# Patient Record
Sex: Female | Born: 1940 | Race: White | Hispanic: No | Marital: Married | Smoking: Never smoker
Health system: Southern US, Community
[De-identification: ages and names within clinical notes are randomized; demographics above are authoritative.]

## PROBLEM LIST (undated history)

## (undated) DIAGNOSIS — H409 Unspecified glaucoma: Secondary | ICD-10-CM

## (undated) DIAGNOSIS — K219 Gastro-esophageal reflux disease without esophagitis: Secondary | ICD-10-CM

## (undated) DIAGNOSIS — M1990 Unspecified osteoarthritis, unspecified site: Secondary | ICD-10-CM

## (undated) DIAGNOSIS — Z853 Personal history of malignant neoplasm of breast: Principal | ICD-10-CM

## (undated) DIAGNOSIS — E785 Hyperlipidemia, unspecified: Secondary | ICD-10-CM

## (undated) DIAGNOSIS — I10 Essential (primary) hypertension: Secondary | ICD-10-CM

## (undated) DIAGNOSIS — C801 Malignant (primary) neoplasm, unspecified: Secondary | ICD-10-CM

## (undated) DIAGNOSIS — Z923 Personal history of irradiation: Secondary | ICD-10-CM

## (undated) DIAGNOSIS — M797 Fibromyalgia: Secondary | ICD-10-CM

## (undated) HISTORY — DX: Hyperlipidemia, unspecified: E78.5

## (undated) HISTORY — PX: REVISION TOTAL HIP ARTHROPLASTY: SHX766

## (undated) HISTORY — DX: Unspecified glaucoma: H40.9

## (undated) HISTORY — DX: Malignant (primary) neoplasm, unspecified: C80.1

## (undated) HISTORY — DX: Personal history of irradiation: Z92.3

## (undated) HISTORY — DX: Unspecified osteoarthritis, unspecified site: M19.90

## (undated) HISTORY — PX: NECK SURGERY: SHX720

## (undated) HISTORY — DX: Fibromyalgia: M79.7

## (undated) HISTORY — PX: BACK SURGERY: SHX140

## (undated) HISTORY — PX: KNEE SURGERY: SHX244

## (undated) HISTORY — DX: Essential (primary) hypertension: I10

## (undated) HISTORY — PX: ABDOMINAL HYSTERECTOMY: SHX81

## (undated) HISTORY — PX: COLONOSCOPY: SHX174

## (undated) HISTORY — DX: Personal history of malignant neoplasm of breast: Z85.3

## (undated) HISTORY — DX: Gastro-esophageal reflux disease without esophagitis: K21.9

---

## 1998-05-25 ENCOUNTER — Emergency Department (HOSPITAL_COMMUNITY): Admission: EM | Admit: 1998-05-25 | Discharge: 1998-05-25 | Payer: Self-pay | Admitting: Emergency Medicine

## 1999-06-26 ENCOUNTER — Other Ambulatory Visit: Admission: RE | Admit: 1999-06-26 | Discharge: 1999-06-26 | Payer: Self-pay | Admitting: Obstetrics and Gynecology

## 1999-08-21 ENCOUNTER — Encounter: Payer: Self-pay | Admitting: Obstetrics and Gynecology

## 1999-08-21 ENCOUNTER — Encounter: Admission: RE | Admit: 1999-08-21 | Discharge: 1999-08-21 | Payer: Self-pay | Admitting: Obstetrics and Gynecology

## 1999-10-30 ENCOUNTER — Ambulatory Visit (HOSPITAL_COMMUNITY): Admission: RE | Admit: 1999-10-30 | Discharge: 1999-10-30 | Payer: Self-pay | Admitting: Emergency Medicine

## 2000-08-05 ENCOUNTER — Other Ambulatory Visit: Admission: RE | Admit: 2000-08-05 | Discharge: 2000-08-05 | Payer: Self-pay | Admitting: Obstetrics and Gynecology

## 2000-08-26 ENCOUNTER — Encounter: Admission: RE | Admit: 2000-08-26 | Discharge: 2000-08-26 | Payer: Self-pay | Admitting: Obstetrics and Gynecology

## 2000-08-26 ENCOUNTER — Encounter: Payer: Self-pay | Admitting: Obstetrics and Gynecology

## 2001-08-25 ENCOUNTER — Other Ambulatory Visit: Admission: RE | Admit: 2001-08-25 | Discharge: 2001-08-25 | Payer: Self-pay | Admitting: Obstetrics and Gynecology

## 2001-09-14 ENCOUNTER — Encounter: Admission: RE | Admit: 2001-09-14 | Discharge: 2001-09-14 | Payer: Self-pay | Admitting: Obstetrics and Gynecology

## 2001-09-14 ENCOUNTER — Encounter: Payer: Self-pay | Admitting: Obstetrics and Gynecology

## 2002-08-27 ENCOUNTER — Other Ambulatory Visit: Admission: RE | Admit: 2002-08-27 | Discharge: 2002-08-27 | Payer: Self-pay | Admitting: Obstetrics and Gynecology

## 2002-09-11 ENCOUNTER — Ambulatory Visit (HOSPITAL_COMMUNITY): Admission: RE | Admit: 2002-09-11 | Discharge: 2002-09-11 | Payer: Self-pay | Admitting: Emergency Medicine

## 2003-09-13 ENCOUNTER — Ambulatory Visit (HOSPITAL_COMMUNITY): Admission: RE | Admit: 2003-09-13 | Discharge: 2003-09-13 | Payer: Self-pay | Admitting: Obstetrics and Gynecology

## 2004-01-31 ENCOUNTER — Ambulatory Visit (HOSPITAL_COMMUNITY): Admission: RE | Admit: 2004-01-31 | Discharge: 2004-02-01 | Payer: Self-pay | Admitting: Orthopaedic Surgery

## 2004-10-09 ENCOUNTER — Ambulatory Visit (HOSPITAL_COMMUNITY): Admission: RE | Admit: 2004-10-09 | Discharge: 2004-10-09 | Payer: Self-pay | Admitting: Obstetrics and Gynecology

## 2005-05-14 ENCOUNTER — Encounter: Admission: RE | Admit: 2005-05-14 | Discharge: 2005-05-14 | Payer: Self-pay | Admitting: Emergency Medicine

## 2005-12-02 ENCOUNTER — Ambulatory Visit (HOSPITAL_COMMUNITY): Admission: RE | Admit: 2005-12-02 | Discharge: 2005-12-02 | Payer: Self-pay | Admitting: Obstetrics and Gynecology

## 2006-12-29 ENCOUNTER — Ambulatory Visit (HOSPITAL_COMMUNITY): Admission: RE | Admit: 2006-12-29 | Discharge: 2006-12-29 | Payer: Self-pay | Admitting: Obstetrics and Gynecology

## 2007-01-05 ENCOUNTER — Encounter: Admission: RE | Admit: 2007-01-05 | Discharge: 2007-01-05 | Payer: Self-pay | Admitting: Obstetrics and Gynecology

## 2007-01-12 ENCOUNTER — Encounter: Admission: RE | Admit: 2007-01-12 | Discharge: 2007-01-12 | Payer: Self-pay | Admitting: Obstetrics and Gynecology

## 2007-12-14 ENCOUNTER — Ambulatory Visit (HOSPITAL_COMMUNITY): Admission: RE | Admit: 2007-12-14 | Discharge: 2007-12-14 | Payer: Self-pay | Admitting: Obstetrics and Gynecology

## 2008-01-01 ENCOUNTER — Encounter: Admission: RE | Admit: 2008-01-01 | Discharge: 2008-01-01 | Payer: Self-pay | Admitting: Obstetrics and Gynecology

## 2008-05-03 HISTORY — PX: BREAST SURGERY: SHX581

## 2008-07-16 ENCOUNTER — Encounter: Admission: RE | Admit: 2008-07-16 | Discharge: 2008-07-16 | Payer: Self-pay | Admitting: Obstetrics and Gynecology

## 2009-01-08 ENCOUNTER — Encounter (INDEPENDENT_AMBULATORY_CARE_PROVIDER_SITE_OTHER): Payer: Self-pay | Admitting: Diagnostic Radiology

## 2009-01-08 ENCOUNTER — Encounter: Admission: RE | Admit: 2009-01-08 | Discharge: 2009-01-08 | Payer: Self-pay | Admitting: Obstetrics and Gynecology

## 2009-01-17 ENCOUNTER — Encounter: Admission: RE | Admit: 2009-01-17 | Discharge: 2009-01-17 | Payer: Self-pay | Admitting: Obstetrics and Gynecology

## 2009-01-21 ENCOUNTER — Ambulatory Visit: Admission: RE | Admit: 2009-01-21 | Discharge: 2009-03-03 | Payer: Self-pay | Admitting: Radiation Oncology

## 2009-01-30 ENCOUNTER — Encounter (INDEPENDENT_AMBULATORY_CARE_PROVIDER_SITE_OTHER): Payer: Self-pay | Admitting: Surgery

## 2009-01-30 ENCOUNTER — Ambulatory Visit (HOSPITAL_BASED_OUTPATIENT_CLINIC_OR_DEPARTMENT_OTHER): Admission: RE | Admit: 2009-01-30 | Discharge: 2009-01-30 | Payer: Self-pay | Admitting: Surgery

## 2009-01-30 ENCOUNTER — Encounter: Admission: RE | Admit: 2009-01-30 | Discharge: 2009-01-30 | Payer: Self-pay | Admitting: Surgery

## 2009-01-31 ENCOUNTER — Emergency Department (HOSPITAL_COMMUNITY): Admission: EM | Admit: 2009-01-31 | Discharge: 2009-02-01 | Payer: Self-pay | Admitting: Emergency Medicine

## 2009-03-05 ENCOUNTER — Ambulatory Visit: Payer: Self-pay | Admitting: Oncology

## 2009-03-05 LAB — CBC WITH DIFFERENTIAL/PLATELET
BASO%: 0.5 % (ref 0.0–2.0)
EOS%: 5.4 % (ref 0.0–7.0)
HCT: 41.3 % (ref 34.8–46.6)
MCH: 32.4 pg (ref 25.1–34.0)
MCHC: 35 g/dL (ref 31.5–36.0)
MONO#: 0.5 10*3/uL (ref 0.1–0.9)
RBC: 4.46 10*6/uL (ref 3.70–5.45)
RDW: 12.8 % (ref 11.2–14.5)
WBC: 4.5 10*3/uL (ref 3.9–10.3)
lymph#: 1 10*3/uL (ref 0.9–3.3)

## 2009-03-06 LAB — COMPREHENSIVE METABOLIC PANEL
ALT: 13 U/L (ref 0–35)
AST: 19 U/L (ref 0–37)
CO2: 27 mEq/L (ref 19–32)
Calcium: 9.4 mg/dL (ref 8.4–10.5)
Chloride: 98 mEq/L (ref 96–112)
Potassium: 3.7 mEq/L (ref 3.5–5.3)
Sodium: 137 mEq/L (ref 135–145)
Total Protein: 6.7 g/dL (ref 6.0–8.3)

## 2009-03-06 LAB — LACTATE DEHYDROGENASE: LDH: 156 U/L (ref 94–250)

## 2009-05-07 ENCOUNTER — Ambulatory Visit: Payer: Self-pay | Admitting: Oncology

## 2009-05-09 LAB — CBC WITH DIFFERENTIAL/PLATELET
EOS%: 3.4 % (ref 0.0–7.0)
Eosinophils Absolute: 0.2 10*3/uL (ref 0.0–0.5)
MCH: 32.8 pg (ref 25.1–34.0)
MCV: 92.9 fL (ref 79.5–101.0)
MONO%: 9.5 % (ref 0.0–14.0)
NEUT#: 2.7 10*3/uL (ref 1.5–6.5)
RBC: 4.32 10*6/uL (ref 3.70–5.45)
RDW: 11.8 % (ref 11.2–14.5)
lymph#: 1.4 10*3/uL (ref 0.9–3.3)

## 2009-05-09 LAB — COMPREHENSIVE METABOLIC PANEL
AST: 18 U/L (ref 0–37)
Albumin: 4.2 g/dL (ref 3.5–5.2)
Alkaline Phosphatase: 71 U/L (ref 39–117)
Chloride: 100 mEq/L (ref 96–112)
Potassium: 3.8 mEq/L (ref 3.5–5.3)
Sodium: 135 mEq/L (ref 135–145)
Total Protein: 6.3 g/dL (ref 6.0–8.3)

## 2009-06-27 ENCOUNTER — Encounter: Admission: RE | Admit: 2009-06-27 | Discharge: 2009-06-27 | Payer: Self-pay | Admitting: Radiation Oncology

## 2009-10-30 ENCOUNTER — Ambulatory Visit: Payer: Self-pay | Admitting: Oncology

## 2009-11-04 LAB — CBC WITH DIFFERENTIAL/PLATELET
BASO%: 0.4 % (ref 0.0–2.0)
EOS%: 4.2 % (ref 0.0–7.0)
HCT: 38.2 % (ref 34.8–46.6)
LYMPH%: 29.3 % (ref 14.0–49.7)
MCH: 32.5 pg (ref 25.1–34.0)
MCHC: 35.4 g/dL (ref 31.5–36.0)
MCV: 91.9 fL (ref 79.5–101.0)
MONO%: 9.1 % (ref 0.0–14.0)
NEUT%: 57 % (ref 38.4–76.8)
Platelets: 177 10*3/uL (ref 145–400)

## 2009-11-04 LAB — COMPREHENSIVE METABOLIC PANEL
ALT: 17 U/L (ref 0–35)
AST: 25 U/L (ref 0–37)
CO2: 27 mEq/L (ref 19–32)
Creatinine, Ser: 0.82 mg/dL (ref 0.40–1.20)
Total Bilirubin: 0.5 mg/dL (ref 0.3–1.2)

## 2009-11-04 LAB — LACTATE DEHYDROGENASE: LDH: 174 U/L (ref 94–250)

## 2009-11-25 ENCOUNTER — Encounter: Admission: RE | Admit: 2009-11-25 | Discharge: 2009-11-25 | Payer: Self-pay | Admitting: Orthopedic Surgery

## 2009-11-27 ENCOUNTER — Encounter: Admission: RE | Admit: 2009-11-27 | Discharge: 2009-11-27 | Payer: Self-pay | Admitting: Orthopedic Surgery

## 2009-12-24 ENCOUNTER — Encounter: Admission: RE | Admit: 2009-12-24 | Discharge: 2009-12-24 | Payer: Self-pay | Admitting: Orthopedic Surgery

## 2010-01-26 ENCOUNTER — Encounter: Admission: RE | Admit: 2010-01-26 | Discharge: 2010-01-26 | Payer: Self-pay | Admitting: Oncology

## 2010-07-22 ENCOUNTER — Other Ambulatory Visit: Payer: Self-pay | Admitting: Oncology

## 2010-07-22 ENCOUNTER — Encounter (HOSPITAL_BASED_OUTPATIENT_CLINIC_OR_DEPARTMENT_OTHER): Payer: Medicare Other | Admitting: Oncology

## 2010-07-22 DIAGNOSIS — Z170 Estrogen receptor positive status [ER+]: Secondary | ICD-10-CM

## 2010-07-22 DIAGNOSIS — C50419 Malignant neoplasm of upper-outer quadrant of unspecified female breast: Secondary | ICD-10-CM

## 2010-07-22 LAB — CBC WITH DIFFERENTIAL/PLATELET
Basophils Absolute: 0 10*3/uL (ref 0.0–0.1)
Eosinophils Absolute: 0.2 10*3/uL (ref 0.0–0.5)
HGB: 12.9 g/dL (ref 11.6–15.9)
MCV: 89.1 fL (ref 79.5–101.0)
MONO%: 8.3 % (ref 0.0–14.0)
NEUT#: 3.7 10*3/uL (ref 1.5–6.5)
RDW: 11.8 % (ref 11.2–14.5)

## 2010-07-22 LAB — COMPREHENSIVE METABOLIC PANEL
Albumin: 3.6 g/dL (ref 3.5–5.2)
CO2: 30 mEq/L (ref 19–32)
Calcium: 8.9 mg/dL (ref 8.4–10.5)
Glucose, Bld: 90 mg/dL (ref 70–99)
Potassium: 3.1 mEq/L — ABNORMAL LOW (ref 3.5–5.3)
Sodium: 136 mEq/L (ref 135–145)
Total Protein: 6 g/dL (ref 6.0–8.3)

## 2010-07-22 LAB — LACTATE DEHYDROGENASE: LDH: 151 U/L (ref 94–250)

## 2010-07-23 LAB — CANCER ANTIGEN 27.29: CA 27.29: 32 U/mL (ref 0–39)

## 2010-07-30 ENCOUNTER — Encounter (HOSPITAL_BASED_OUTPATIENT_CLINIC_OR_DEPARTMENT_OTHER): Payer: Medicare Other | Admitting: Oncology

## 2010-07-30 DIAGNOSIS — Z170 Estrogen receptor positive status [ER+]: Secondary | ICD-10-CM

## 2010-07-30 DIAGNOSIS — C50419 Malignant neoplasm of upper-outer quadrant of unspecified female breast: Secondary | ICD-10-CM

## 2010-08-07 LAB — DIFFERENTIAL
Eosinophils Absolute: 0.3 10*3/uL (ref 0.0–0.7)
Eosinophils Relative: 7 % — ABNORMAL HIGH (ref 0–5)
Lymphocytes Relative: 27 % (ref 12–46)
Lymphs Abs: 1.2 10*3/uL (ref 0.7–4.0)
Monocytes Relative: 9 % (ref 3–12)
Neutrophils Relative %: 58 % (ref 43–77)

## 2010-08-07 LAB — URINALYSIS, ROUTINE W REFLEX MICROSCOPIC
Glucose, UA: NEGATIVE mg/dL
Hgb urine dipstick: NEGATIVE
Protein, ur: NEGATIVE mg/dL
pH: 7.5 (ref 5.0–8.0)

## 2010-08-07 LAB — COMPREHENSIVE METABOLIC PANEL
ALT: 20 U/L (ref 0–35)
AST: 24 U/L (ref 0–37)
Calcium: 9.5 mg/dL (ref 8.4–10.5)
GFR calc Af Amer: >60 mL/min (ref >60)
Sodium: 137 mEq/L (ref 135–145)
Total Protein: 6.6 g/dL (ref 6.0–8.3)

## 2010-08-07 LAB — URINE MICROSCOPIC-ADD ON

## 2010-08-07 LAB — CBC
MCHC: 35.3 g/dL (ref 30.0–36.0)
RDW: 12 % (ref 11.5–15.5)

## 2010-09-18 NOTE — Op Note (Signed)
NAMEHOLLIE, WOJAHN NO.:  [id number]   MEDICAL RECORD NO.:  [id number]          PATIENT TYPE:  OIB   LOCATION:  5021                         FACILITY:  [facility]   PHYSICIAN:  Mark C. Ophelia Charter, M.D.    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   PROCEDURE:  C4-5 and C5-6 anterior cervical diskectomies and fusion,  allograft and plating.   SURGEON:  Mark C. Ophelia Charter, M.D.   ASSISTANT:  Sandrea Matte, P.A.   ANESTHESIA:  GOT.   ESTIMATED BLOOD LOSS:  Less than 100 mL.   DRAINS:  One medium Hemovac.   PROCEDURE:  After induction of general anesthesia, orotracheal intubation,  preoperative Ancef prophylaxis, 2-pound sandbag behind the neck and head  halter traction application, the neck was prepped with Duraprep.  The usual  area was squared with towels, Betadine and Vi-Drape applied, sterile Mayo  stand at the head and thyroid sheet and draped.  Incision was made at the  midline, extending to the left, directly over the C5 vertebra.  This was  above the omohyoid and carotid sheath and contents were lateral.  The longus  colli muscles were elevated after needle localization with cross-table  lateral x-ray confirmed that the needle was in the 4-5 space.  The 4-5 space  was addressed first with teeth blades over the collateral right and left,  smooth blades up and down.  The longus colli muscles were elevated and put  the teeth of the blades underneath the longus colli.  Diskectomy was  performed with a 15 scalpel blade, Cloward curettes and micropituitary.  Operative microscope was brought in, TPS bur was used with 4-mm round bur to  bur the endplates.  Continued burring and irrigating were performed until  the posterior longitudinal ligament was obtained and visualized.   Spurs were  removed with the 1- and 2-mm Kerrisons under the draped microscope and  uncovertebral joint spurs were removed, which were considerable.  There was  about a 1-mm space posteriorly due to bridging osteophytes posteriorly that  were causing compression of the epidural space.  Once spurs were removed,  posterior longitudinal ligament was taken down.  Dura was completely  visualized.  Palpation with a nerve hook around the edges to make sure all  areas were free was performed, some additional trimming of spurs to make  sure there was complete decompression.  Burring was performed so that there  was good symmetry for the allograft.  A 7-mm straight graft was selected and  with the head-holder traction applied, it was impacted into place; based on  depth-gauge measurements, it was countersunk about 1.5 mm.  Identical  procedure was repeated at the 5-6 level using the operating microscope,  taking down the posterior longitudinal ligament, removing spurs and  stripping the gutters.  There was room on each side for egress of fluid on  each side of the graft,  then a 6-mm straight allograft was placed with the  DBX putty in it as well.  A 31 plate was selected, Synthes, and the holes  were hand-drilled.  It was checked under fluoroscopy.  A rescue screw was  placed superior on the right, since the original 4-mm screw was slightly  angled superiorly so that the screw would not sit down flat with the plate.  The hole was redrilled straight and then the rescue screw was inserted with  good purchase.  Additional screws were the regular 4.0.  All locking screws  were placed.  AP and lateral fluoroscopy was used to confirm appropriate  position and after irrigation and locking screws were tightened, Hemovac was  placed through a separate stab incision using a trocar in-out technique,  platysma closed with 4-0 Vicryl, subcuticular skin closure, tincture of  Benzoin, Steri-Strips, and Marcaine  infiltration, postop dressing, soft  cervical collar.  Instrument count and needle count were correct.       MCY/MEDQ  D:  01/31/2004  T:  02/01/2004  Job:  [id number]

## 2010-09-23 ENCOUNTER — Encounter (INDEPENDENT_AMBULATORY_CARE_PROVIDER_SITE_OTHER): Payer: Self-pay | Admitting: Surgery

## 2010-12-31 IMAGING — CR DG FOOT 2V*R*
2 series · 2 of 2 positions shown · non-contrast
Comparison: None

CLINICAL DATA: Right foot pain, swelling.

RIGHT FOOT - 2 VIEW

[view not recorded (1 of 2)]
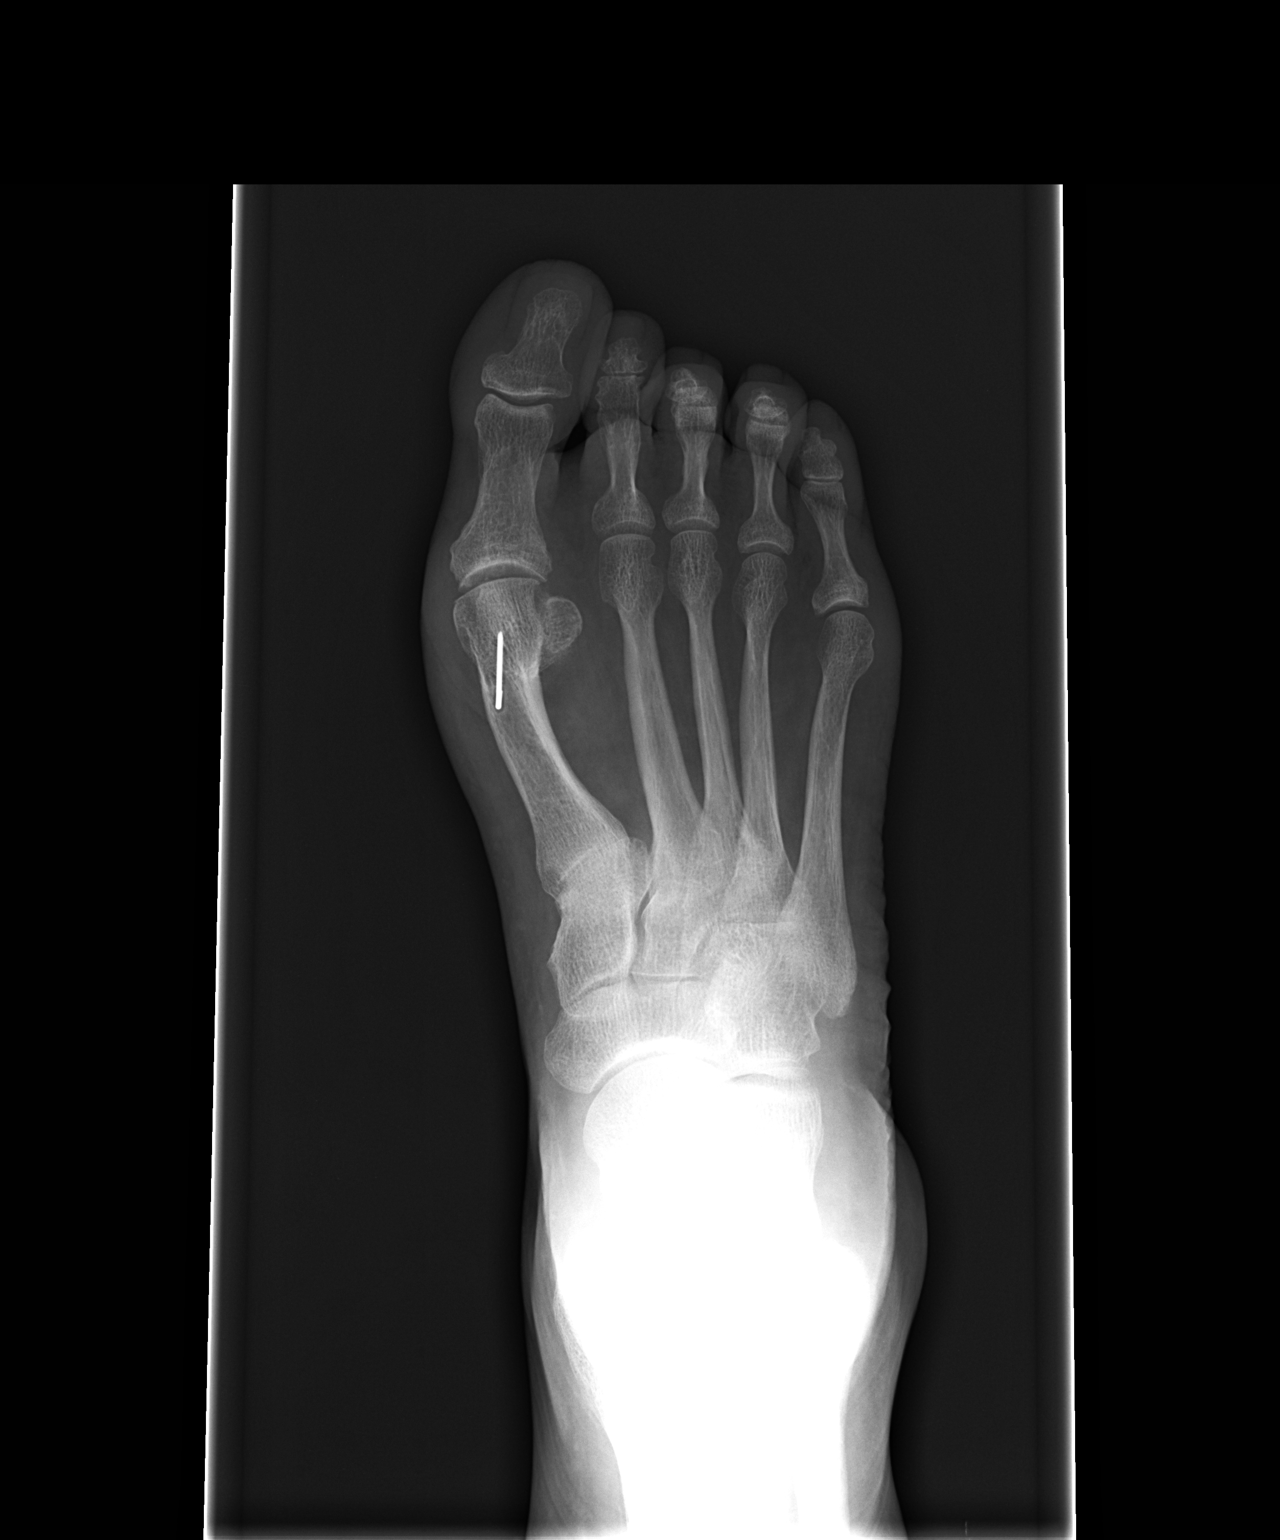

[view not recorded (2 of 2)]
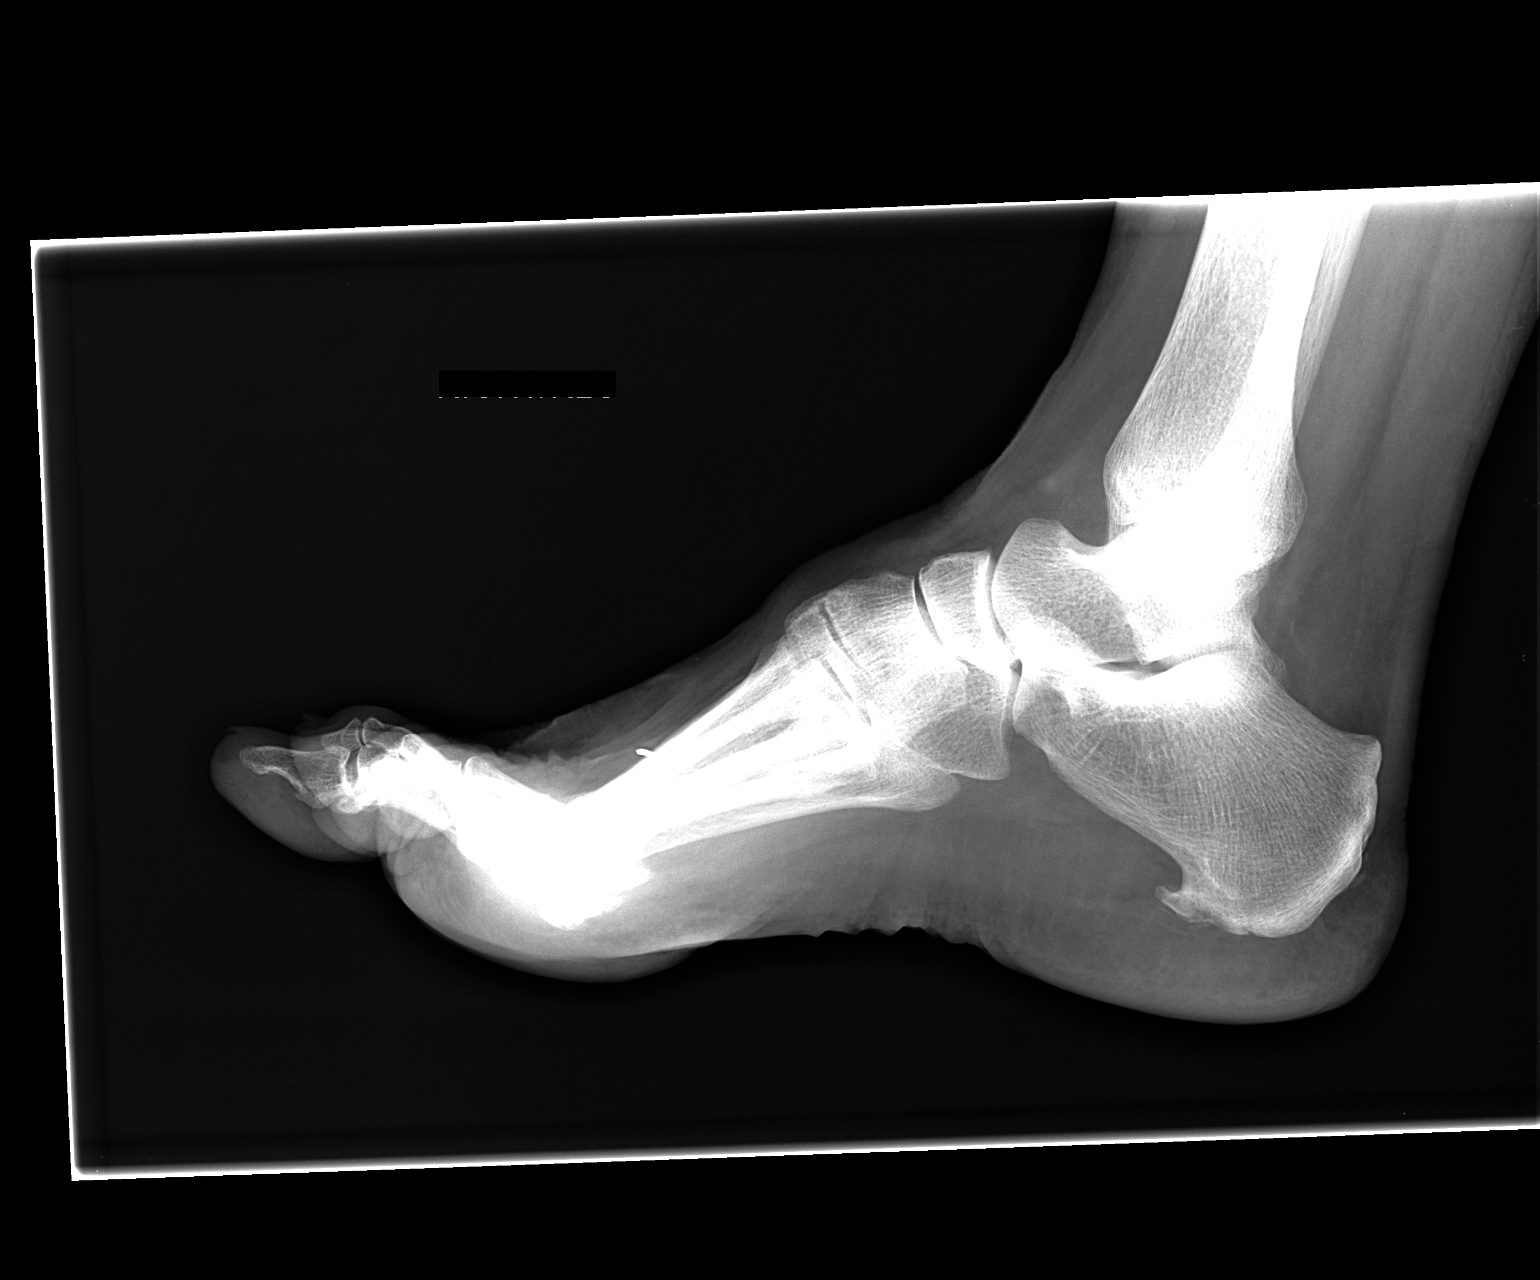

[2 of 2 positions shown; findings below may reference images not displayed]

FINDINGS: Postoperative changes are noted in the right first
metatarsal.  Plantar calcaneal spur is noted. No acute bony
abnormality.  Specifically, no fracture, subluxation, or
dislocation.  Soft tissues are intact.
IMPRESSION: Postoperative changes in the first metatarsal.

Plantar calcaneal spur

## 2011-01-06 ENCOUNTER — Other Ambulatory Visit: Payer: Self-pay | Admitting: Obstetrics and Gynecology

## 2011-01-06 ENCOUNTER — Other Ambulatory Visit: Payer: Self-pay | Admitting: Oncology

## 2011-01-06 DIAGNOSIS — Z853 Personal history of malignant neoplasm of breast: Secondary | ICD-10-CM

## 2011-01-06 DIAGNOSIS — Z9889 Other specified postprocedural states: Secondary | ICD-10-CM

## 2011-01-07 ENCOUNTER — Ambulatory Visit
Admission: RE | Admit: 2011-01-07 | Discharge: 2011-01-07 | Disposition: A | Discharge status: Home / Self Care | Payer: Medicare Other | Source: Ambulatory Visit | Attending: Radiation Oncology | Admitting: Radiation Oncology

## 2011-01-20 ENCOUNTER — Other Ambulatory Visit: Payer: Self-pay | Admitting: Oncology

## 2011-01-20 ENCOUNTER — Encounter (HOSPITAL_BASED_OUTPATIENT_CLINIC_OR_DEPARTMENT_OTHER): Payer: Medicare Other | Admitting: Oncology

## 2011-01-20 DIAGNOSIS — Z170 Estrogen receptor positive status [ER+]: Secondary | ICD-10-CM

## 2011-01-20 DIAGNOSIS — C50419 Malignant neoplasm of upper-outer quadrant of unspecified female breast: Secondary | ICD-10-CM

## 2011-01-20 LAB — CBC WITH DIFFERENTIAL/PLATELET
BASO%: 0.3 % (ref 0.0–2.0)
Eosinophils Absolute: 0.3 10*3/uL (ref 0.0–0.5)
HCT: 38.1 % (ref 34.8–46.6)
LYMPH%: 24.5 % (ref 14.0–49.7)
MCHC: 36 g/dL (ref 31.5–36.0)
MCV: 88.2 fL (ref 79.5–101.0)
MONO#: 0.5 10*3/uL (ref 0.1–0.9)
MONO%: 8.2 % (ref 0.0–14.0)
NEUT%: 62.2 % (ref 38.4–76.8)
Platelets: 208 10*3/uL (ref 145–400)
RBC: 4.32 10*6/uL (ref 3.70–5.45)
WBC: 5.9 10*3/uL (ref 3.9–10.3)

## 2011-01-20 LAB — COMPREHENSIVE METABOLIC PANEL
AST: 24 U/L (ref 0–37)
Albumin: 3.5 g/dL (ref 3.5–5.2)
Alkaline Phosphatase: 63 U/L (ref 39–117)
BUN: 11 mg/dL (ref 6–23)
Creatinine, Ser: 0.71 mg/dL (ref 0.50–1.10)
Glucose, Bld: 139 mg/dL — ABNORMAL HIGH (ref 70–99)
Total Bilirubin: 0.3 mg/dL (ref 0.3–1.2)

## 2011-01-21 LAB — VITAMIN D 25 HYDROXY (VIT D DEFICIENCY, FRACTURES): Vit D, 25-Hydroxy: 57 ng/mL (ref 30–89)

## 2011-02-09 ENCOUNTER — Ambulatory Visit
Admission: RE | Admit: 2011-02-09 | Discharge: 2011-02-09 | Disposition: A | Discharge status: Home / Self Care | Payer: Medicare Other | Source: Ambulatory Visit | Attending: Obstetrics and Gynecology | Admitting: Obstetrics and Gynecology

## 2011-02-09 DIAGNOSIS — Z9889 Other specified postprocedural states: Secondary | ICD-10-CM

## 2011-02-09 DIAGNOSIS — Z853 Personal history of malignant neoplasm of breast: Secondary | ICD-10-CM

## 2011-02-15 ENCOUNTER — Encounter (HOSPITAL_BASED_OUTPATIENT_CLINIC_OR_DEPARTMENT_OTHER): Payer: Medicare Other | Admitting: Oncology

## 2011-02-15 DIAGNOSIS — C50919 Malignant neoplasm of unspecified site of unspecified female breast: Secondary | ICD-10-CM

## 2011-02-15 DIAGNOSIS — Z170 Estrogen receptor positive status [ER+]: Secondary | ICD-10-CM

## 2011-02-15 DIAGNOSIS — Z923 Personal history of irradiation: Secondary | ICD-10-CM

## 2011-03-15 ENCOUNTER — Encounter (INDEPENDENT_AMBULATORY_CARE_PROVIDER_SITE_OTHER): Payer: Self-pay | Admitting: General Surgery

## 2011-03-15 DIAGNOSIS — C50419 Malignant neoplasm of upper-outer quadrant of unspecified female breast: Secondary | ICD-10-CM | POA: Insufficient documentation

## 2011-03-15 DIAGNOSIS — Z853 Personal history of malignant neoplasm of breast: Secondary | ICD-10-CM

## 2011-03-15 HISTORY — DX: Personal history of malignant neoplasm of breast: Z85.3

## 2011-03-16 ENCOUNTER — Encounter (INDEPENDENT_AMBULATORY_CARE_PROVIDER_SITE_OTHER): Payer: Self-pay | Admitting: Surgery

## 2011-03-16 ENCOUNTER — Ambulatory Visit (INDEPENDENT_AMBULATORY_CARE_PROVIDER_SITE_OTHER): Payer: Medicare Other | Admitting: Surgery

## 2011-03-16 VITALS — BP 126/68 | HR 66 | Temp 97.4°F | Resp 16 | Ht 65.0 in | Wt 170.4 lb

## 2011-03-16 DIAGNOSIS — Z853 Personal history of malignant neoplasm of breast: Secondary | ICD-10-CM

## 2011-03-16 NOTE — Patient Instructions (Signed)
Ill see you again in a year, but if you have any problems or concerns come to see me sooner

## 2011-03-16 NOTE — Progress Notes (Signed)
NAME: Miranda Zavala       DOB: [birth year]           DATE: 03/16/2011       MRN: [id number]   SAWSAN RIGGIO is a 70 y.o.Marland Kitchenfemale who presents for routine followup of her Left breast cancer diagnosed in 2010 and treated with lumpectomy, SLN, Mammosite radiation. She has sontinued discomfort at the lumpectomy site and it remains both tender and hard, It has not changed in one year  PFSH: She has had no significant changes since the last visit here.  ROS: There have been no significant changes since the last visit here  EXAM: General: The patient is alert, oriented, generally healty appearing, NAD. Mood and affect are normal.  Breasts:  Right breast normal to exam. Left shows the lateral lumpectomy site to be tender and hard. ? Of chronic seroma vs radiation changes. Not different from last year  Lymphatics: She has no axillary or supraclavicular adenopathy on either side.  Extremities: Full ROM of the surgical side with no lymphedema noted.  Data Reviewed: Mammogram last month negative  Impression: Doing well, with no evidence of recurrent cancer or new cancer  Plan: Will continue to follow up on an annual basis here.

## 2011-03-31 NOTE — Progress Notes (Signed)
CC:   Georgianne Fick, M.D. Oneita Hurt, M.D. Huel Cote, M.D. Currie Paris, M.D.  PROBLEM: 1. T1c N0, ER/PR positive breast cancer, status post lumpectomy on     01/03/2009, status post MammoSite radiation based therapy completed     on 02/25/2009, on Tamoxifen. 2. History of fibromyalgia.  INTERVAL HISTORY:  Ms. Miranda Zavala returns for followup.  Since being seen last, she has been doing fairly well.  She saw Dr. Kathrynn Running for review of her MammoSite treatment.  She appears to be doing well from that perspective.  She has no other complaints.  ECOG status is zero.  MEDICATIONS:  The list was reviewed.  She continues on HydroDIURIL, Mevacor, Tamoxifen, __________, Elavil, Prilosec, and vitamin C.  As far as Tamoxifen is concerned, she has had no significant side effects from this in terms of vaginal bleeding, etc.  PHYSICAL EXAMINATION:  General Appearance:  A pleasant, alert woman looking stated age.  Vital Signs:  Blood pressure is 159/74. Temperature 98.1.  Pulse 74.  Respiratory rate 20.  Weight is 171.7. Head and Neck Exam:  No palpable adenopathy in the head and neck area. Trachea is midline.  No thyromegaly.  Lungs:  Clear.  Heart:  Sounds are normal.  Breast Exam:  The lumpectomy site appears to be doing fairly well.  There is a seroma present.  This is fairly mobile and slightly nodular.  No other evidence of nipple retraction or other skin changes. The contralateral breast is normal.  Both axillae are negative. Abdomen:  Soft.  There is no palpable hepatosplenomegaly.  No inguinal adenopathy.  Extremities:  No peripheral cyanosis, clubbing, or edema.  IMPRESSION AND PLAN:  Ms. Gallen is doing well.  I will see her again in followup in 6 months' time with the appropriate imaging studies.    ______________________________ Pierce Crane, M.D., F.R.C.P.C. PR/MEDQ  D:  03/31/2011  T:  03/31/2011  Job:  [id number]

## 2011-06-05 ENCOUNTER — Telehealth: Payer: Self-pay | Admitting: Oncology

## 2011-06-05 NOTE — Telephone Encounter (Signed)
called pt lmovm for appts for april2013

## 2011-07-27 ENCOUNTER — Telehealth: Payer: Self-pay | Admitting: *Deleted

## 2011-07-27 NOTE — Telephone Encounter (Signed)
called patient patient confirmed over the phone the new date and time on 08-16-2011 starting at 1:30pm

## 2011-08-16 ENCOUNTER — Ambulatory Visit (HOSPITAL_BASED_OUTPATIENT_CLINIC_OR_DEPARTMENT_OTHER): Payer: Medicare Other | Admitting: Oncology

## 2011-08-16 ENCOUNTER — Other Ambulatory Visit (HOSPITAL_BASED_OUTPATIENT_CLINIC_OR_DEPARTMENT_OTHER): Payer: Medicare Other | Admitting: Lab

## 2011-08-16 VITALS — BP 146/76 | HR 93 | Temp 98.3°F | Ht 65.0 in | Wt 172.3 lb

## 2011-08-16 DIAGNOSIS — Z853 Personal history of malignant neoplasm of breast: Secondary | ICD-10-CM

## 2011-08-16 DIAGNOSIS — C50419 Malignant neoplasm of upper-outer quadrant of unspecified female breast: Secondary | ICD-10-CM

## 2011-08-16 DIAGNOSIS — Z170 Estrogen receptor positive status [ER+]: Secondary | ICD-10-CM

## 2011-08-16 DIAGNOSIS — Z8501 Personal history of malignant neoplasm of esophagus: Secondary | ICD-10-CM

## 2011-08-16 LAB — CBC WITH DIFFERENTIAL/PLATELET
Basophils Absolute: 0 10*3/uL (ref 0.0–0.1)
Eosinophils Absolute: 0.2 10*3/uL (ref 0.0–0.5)
HCT: 39.4 % (ref 34.8–46.6)
LYMPH%: 22.1 % (ref 14.0–49.7)
MCV: 92.5 fL (ref 79.5–101.0)
MONO#: 0.5 10*3/uL (ref 0.1–0.9)
MONO%: 7.5 % (ref 0.0–14.0)
NEUT#: 4.1 10*3/uL (ref 1.5–6.5)
NEUT%: 66.3 % (ref 38.4–76.8)
Platelets: 204 10*3/uL (ref 145–400)
RBC: 4.26 10*6/uL (ref 3.70–5.45)
nRBC: 0 % (ref 0–0)

## 2011-08-16 MED ORDER — ANASTROZOLE 1 MG PO TABS: 1.0000 mg | ORAL_TABLET | Freq: Every day | ORAL | Status: AC

## 2011-08-16 NOTE — Patient Instructions (Addendum)

## 2011-08-16 NOTE — Progress Notes (Signed)
Hematology and Oncology Follow Up Visit  NATASA STIGALL [id number] [birth year] 71 y.o. 08/16/2011 3:16 PM PCP  Principle Diagnosis: T1CN0 breast cancer s/p lumpectomy and mammosite , completed 02/25/09, on tamoxifen.  Interim History:  There have been no intercurrent illness, hospitalizations or medication changes.  Medications: I have reviewed the patient's current medications.  Allergies:  Allergies  Allergen Reactions  . Demerol Other (See Comments)    Made patient feel like she was "out of it", not really aware of surroundings.  . Codeine Itching    All over the body    Past Medical History, Surgical history, Social history, and Family History were reviewed and updated.  Review of Systems: Constitutional:  Negative for fever, chills, night sweats, anorexia, weight loss, pain. Cardiovascular: no chest pain or dyspnea on exertion Respiratory: no cough, shortness of breath, or wheezing Neurological: no TIA or stroke symptoms Dermatological: negative ENT: negative Skin Gastrointestinal: no abdominal pain, change in bowel habits, or black or bloody stools Genito-Urinary: no dysuria, trouble voiding, or hematuria Hematological and Lymphatic: negative Breast: positive for lt breast discomfort Musculoskeletal: negative Remaining ROS negative.  Physical Exam: Blood pressure 146/76, pulse 93, temperature 98.3 F (36.8 C), temperature source Oral, height 5\' 5"  (1.651 m), weight 172 lb 4.8 oz (78.155 kg). ECOG:  General appearance: alert, cooperative and appears stated age Head: Normocephalic, without obvious abnormality, atraumatic Neck: no adenopathy, no carotid bruit, no JVD, supple, symmetrical, trachea midline and thyroid not enlarged, symmetric, no tenderness/mass/nodules Lymph nodes: Cervical, supraclavicular, and axillary nodes normal. Cardiac : regular rate and rhythm, no murmurs or gallops Pulmonary:clear to auscultation bilaterally and normal percussion  bilaterally Breasts: inspection negative, no nipple discharge or bleeding, no masses or nodularity palpable and negative apart from seroma in breast which is stable. Abdomen:soft, non-tender; bowel sounds normal; no masses,  no organomegaly Extremities negative Neuro: alert, oriented, normal speech, no focal findings or movement disorder noted  Lab Results: Lab Results  Component Value Date   WBC 6.2 08/16/2011   HGB 13.9 08/16/2011   HCT 39.4 08/16/2011   MCV 92.5 08/16/2011   PLT 204 08/16/2011     Chemistry      Component Value Date/Time   NA 137 01/20/2011 1513   NA 137 01/20/2011 1513   K 2.8* 01/20/2011 1513   K 2.8* 01/20/2011 1513   CL 98 01/20/2011 1513   CL 98 01/20/2011 1513   CO2 28 01/20/2011 1513   CO2 28 01/20/2011 1513   BUN 11 01/20/2011 1513   BUN 11 01/20/2011 1513   CREATININE 0.71 01/20/2011 1513   CREATININE 0.71 01/20/2011 1513      Component Value Date/Time   CALCIUM 9.0 01/20/2011 1513   CALCIUM 9.0 01/20/2011 1513   ALKPHOS 63 01/20/2011 1513   ALKPHOS 63 01/20/2011 1513   AST 24 01/20/2011 1513   AST 24 01/20/2011 1513   ALT 19 01/20/2011 1513   ALT 19 01/20/2011 1513   BILITOT 0.3 01/20/2011 1513   BILITOT 0.3 01/20/2011 1513      .pathology. Radiological Studies: chest X-ray n/a Mammogram Due 10/13 Bone density Due 8/13  Impression and Plan: Ms Siemen is doing well, i havre explained the switch to arimidex and she wil stay on tamoxifen for another 3 months and then switch. We will have  abone density and mammogram before she returns.  More than 50% of the visit was spent in patient-related counselling   Pierce Crane, MD 4/15/20133:16 PM

## 2011-08-17 LAB — COMPREHENSIVE METABOLIC PANEL
BUN: 12 mg/dL (ref 6–23)
CO2: 28 mEq/L (ref 19–32)
Calcium: 9.2 mg/dL (ref 8.4–10.5)
Chloride: 100 mEq/L (ref 96–112)
Creatinine, Ser: 0.79 mg/dL (ref 0.50–1.10)
Glucose, Bld: 117 mg/dL — ABNORMAL HIGH (ref 70–99)
Total Bilirubin: 0.4 mg/dL (ref 0.3–1.2)

## 2011-08-17 LAB — VITAMIN D 25 HYDROXY (VIT D DEFICIENCY, FRACTURES): Vit D, 25-Hydroxy: 72 ng/mL (ref 30–89)

## 2011-08-19 ENCOUNTER — Other Ambulatory Visit: Payer: Medicare Other | Admitting: Lab

## 2011-08-19 ENCOUNTER — Ambulatory Visit: Payer: Medicare Other | Admitting: Oncology

## 2011-09-01 ENCOUNTER — Ambulatory Visit: Payer: Medicare Other | Admitting: Oncology

## 2011-09-01 ENCOUNTER — Other Ambulatory Visit: Payer: Medicare Other | Admitting: Lab

## 2012-01-06 ENCOUNTER — Ambulatory Visit: Payer: Medicare Other | Admitting: Radiation Oncology

## 2012-01-13 ENCOUNTER — Ambulatory Visit: Payer: Medicare Other | Admitting: Radiation Oncology

## 2012-01-19 ENCOUNTER — Encounter: Payer: Self-pay | Admitting: Radiation Oncology

## 2012-01-19 DIAGNOSIS — Z923 Personal history of irradiation: Secondary | ICD-10-CM | POA: Insufficient documentation

## 2012-01-20 ENCOUNTER — Ambulatory Visit
Admission: RE | Admit: 2012-01-20 | Discharge: 2012-01-20 | Disposition: A | Discharge status: Home / Self Care | Payer: Medicare Other | Source: Ambulatory Visit | Attending: Radiation Oncology | Admitting: Radiation Oncology

## 2012-01-20 ENCOUNTER — Encounter: Payer: Self-pay | Admitting: Radiation Oncology

## 2012-01-20 VITALS — BP 137/62 | HR 74 | Temp 97.8°F | Resp 20 | Wt 172.5 lb

## 2012-01-20 DIAGNOSIS — Z923 Personal history of irradiation: Secondary | ICD-10-CM

## 2012-01-20 DIAGNOSIS — Z853 Personal history of malignant neoplasm of breast: Secondary | ICD-10-CM

## 2012-01-20 NOTE — Assessment & Plan Note (Signed)
The patient is continuing antiestrogen therapy switching from tamoxifen to Arimidex.  Mammography shows no evidence of recurrence. She has undergone breast conservation with no evidence of disease, a good cosmetic result, but posttreatment fibrosis/pain.

## 2012-01-20 NOTE — Progress Notes (Signed)
  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  She continues to several with tenderness at the lumpectomy site related to her partial breast radiation. Otherwise, she is without complaint. She undergoes annual mammography. She continues to follow annually with Dr. Jamey Ripa as well as Dr. Donnie Coffin more frequently. She is continuing to receive tamoxifen therapy and will switch to Arimidex soon.                              ALLERGIES:  is allergic to demerol and codeine.  Meds: Current Outpatient Prescriptions  Medication Sig Dispense Refill  . anastrozole (ARIMIDEX) 1 MG tablet 1 mg daily.       . Cholecalciferol (VITAMIN D PO) Take 1,000 mg by mouth daily.        . clobetasol (TEMOVATE) 0.05 % ointment 1 application as needed.       . gabapentin (NEURONTIN) 300 MG capsule       . hydrochlorothiazide (HYDRODIURIL) 25 MG tablet 25 mg daily.       Marland Kitchen ibuprofen (ADVIL,MOTRIN) 800 MG tablet 800 mg as needed.       Marland Kitchen losartan (COZAAR) 100 MG tablet 100 mg daily.       Marland Kitchen LOVASTATIN PO Take 40 mg by mouth daily.        . meloxicam (MOBIC) 7.5 MG tablet       . omeprazole (PRILOSEC) 40 MG capsule Take 40 mg by mouth as needed.        . potassium chloride SA (K-DUR,KLOR-CON) 20 MEQ tablet 20 mEq daily.         Physical Findings: The patient is in no acute distress. Patient is alert and oriented.  weight is 172 lb 8 oz (78.245 kg). Her oral temperature is 97.8 F (36.6 C). Her blood pressure is 137/62 and her pulse is 74. Her respiration is 20.  the supra-auricular regions free of  adenopathy. Bilateral axillae are free of adenopathy. Lungs are clear to auscultation heart is regular. Examination of breasts in seated position reveals some retraction at the lobectomy site in the upper-outer quadrant with no significant skin changes or significant architectural distortion. Palpation of the left breast in a supine position reveals a fibrotic region at the lumpectomy site which is tender to palpation. This is the dominant mass noted within the breast. The remainder of the breast contains atrophic glandular tissue..  No significant changes.  Impression:  The patient is recovering from the effects of radiation.  She is a good cosmetic result following breast conservation with no evidence of disease. She does have some chronic pain in the treated breast which may be related to her accelerated partial breast radiation.  Plan:  The patient will continue to undergo annual mammography and followup with her surgeon and medical oncologist. She'll return to radiation oncology clinic in the future on an as-needed basis.  _____________________________________  Artist Pais Kathrynn Running, M.D.

## 2012-01-20 NOTE — Progress Notes (Signed)
Pt reports she continues to have left breast "soreness that comes and goes". She denies any other problems. States she received a letter to call and schedule her mammogram for Oct 2013. Pt taking Arimidex. Dr Donnie Coffin d/c Tamoxifen.

## 2012-01-31 ENCOUNTER — Other Ambulatory Visit (INDEPENDENT_AMBULATORY_CARE_PROVIDER_SITE_OTHER): Payer: Self-pay | Admitting: Surgery

## 2012-01-31 DIAGNOSIS — Z853 Personal history of malignant neoplasm of breast: Secondary | ICD-10-CM

## 2012-01-31 DIAGNOSIS — Z9889 Other specified postprocedural states: Secondary | ICD-10-CM

## 2012-02-10 ENCOUNTER — Ambulatory Visit
Admission: RE | Admit: 2012-02-10 | Discharge: 2012-02-10 | Disposition: A | Discharge status: Home / Self Care | Payer: Medicare Other | Source: Ambulatory Visit | Attending: Surgery | Admitting: Surgery

## 2012-02-10 DIAGNOSIS — Z853 Personal history of malignant neoplasm of breast: Secondary | ICD-10-CM

## 2012-02-10 DIAGNOSIS — Z9889 Other specified postprocedural states: Secondary | ICD-10-CM

## 2012-02-18 ENCOUNTER — Encounter (INDEPENDENT_AMBULATORY_CARE_PROVIDER_SITE_OTHER): Payer: Self-pay | Admitting: Surgery

## 2012-02-18 ENCOUNTER — Ambulatory Visit (INDEPENDENT_AMBULATORY_CARE_PROVIDER_SITE_OTHER): Payer: Medicare Other | Admitting: Surgery

## 2012-02-18 VITALS — BP 136/72 | HR 88 | Temp 97.5°F | Resp 18 | Ht 65.0 in | Wt 171.0 lb

## 2012-02-18 DIAGNOSIS — Z853 Personal history of malignant neoplasm of breast: Secondary | ICD-10-CM

## 2012-02-18 NOTE — Patient Instructions (Signed)
We will see you again on an as needed basis. Please call the office at [phone] if you have any questions or concerns. Thank you for allowing [place] to take care of you. Continue to have annual mammograms

## 2012-02-18 NOTE — Progress Notes (Signed)
NAME: MARIYAH UPSHAW       DOB: 17-Apr-1941           DATE: 02/18/2012       MRN: [id number]   LEEZA HEINER is a 71 y.o.Marland Kitchenfemale who presents for routine followup of her Left breast cancer, ILC, StageI. Receptor+, diagnosed in 2010 and treated with lumpectomy, SLN, Mammosite radiation. She has continued tenderness at the lumpectomy site, but no longer any pain.  PFSH: She has had no significant changes since the last visit here.  ROS: There have been no significant changes since the last visit here  EXAM: General: The patient is alert, oriented, generally healty appearing, NAD. Mood and affect are normal.  Breasts:  Right breast normal to exam. Left shows the lateral lumpectomy site to be tender and hard. ? Of chronic seroma vs radiation changes. Not different from last year  Lymphatics: She has no axillary or supraclavicular adenopathy on either side.  Extremities: Full ROM of the surgical side with no lymphedema noted.  Data Reviewed: Mammogram last week OK, no change, looks like seroma at lumpectomy site  Impression: Doing well, with no evidence of recurrent cancer or new cancer  Plan: RTC PRN. She would like to reduce the number of physicians seeing her and will continue to see Dr Donnie Coffin. Recommended she continue annual mammograms

## 2012-03-07 ENCOUNTER — Other Ambulatory Visit: Payer: Self-pay | Admitting: *Deleted

## 2012-03-07 DIAGNOSIS — C50419 Malignant neoplasm of upper-outer quadrant of unspecified female breast: Secondary | ICD-10-CM

## 2012-03-08 ENCOUNTER — Other Ambulatory Visit (HOSPITAL_BASED_OUTPATIENT_CLINIC_OR_DEPARTMENT_OTHER): Payer: Medicare Other | Admitting: Lab

## 2012-03-08 ENCOUNTER — Ambulatory Visit (HOSPITAL_BASED_OUTPATIENT_CLINIC_OR_DEPARTMENT_OTHER): Payer: Medicare Other | Admitting: Oncology

## 2012-03-08 VITALS — BP 144/73 | HR 72 | Temp 97.5°F | Resp 20 | Ht 65.0 in | Wt 173.4 lb

## 2012-03-08 DIAGNOSIS — Z170 Estrogen receptor positive status [ER+]: Secondary | ICD-10-CM

## 2012-03-08 DIAGNOSIS — C50419 Malignant neoplasm of upper-outer quadrant of unspecified female breast: Secondary | ICD-10-CM

## 2012-03-08 DIAGNOSIS — M2550 Pain in unspecified joint: Secondary | ICD-10-CM

## 2012-03-08 LAB — CBC WITH DIFFERENTIAL/PLATELET
BASO%: 1.1 % (ref 0.0–2.0)
Eosinophils Absolute: 0.4 10*3/uL (ref 0.0–0.5)
HCT: 38.3 % (ref 34.8–46.6)
LYMPH%: 27.3 % (ref 14.0–49.7)
MCHC: 35.5 g/dL (ref 31.5–36.0)
MCV: 90.7 fL (ref 79.5–101.0)
MONO#: 0.6 10*3/uL (ref 0.1–0.9)
MONO%: 9.4 % (ref 0.0–14.0)
NEUT%: 56.8 % (ref 38.4–76.8)
Platelets: 195 10*3/uL (ref 145–400)
RBC: 4.22 10*6/uL (ref 3.70–5.45)
WBC: 6.7 10*3/uL (ref 3.9–10.3)

## 2012-03-08 LAB — COMPREHENSIVE METABOLIC PANEL (CC13)
BUN: 13 mg/dL (ref 7.0–26.0)
CO2: 29 mEq/L (ref 22–29)
Calcium: 9.6 mg/dL (ref 8.4–10.4)
Chloride: 101 mEq/L (ref 98–107)
Creatinine: 0.8 mg/dL (ref 0.6–1.1)
Total Bilirubin: 0.57 mg/dL (ref 0.20–1.20)

## 2012-03-08 LAB — LACTATE DEHYDROGENASE (CC13): LDH: 205 U/L (ref 125–220)

## 2012-03-08 NOTE — Progress Notes (Signed)
Hematology and Oncology Follow Up Visit  Miranda Zavala [id number] [birth year] 71 y.o. 03/08/2012 1:14 PM PCP  Principle Diagnosis: T1CN0 breast cancer s/p lumpectomy and mammosite , completed 02/25/09, on tamoxifen, on arimidex.  Interim History:  There have been no intercurrent illness, hospitalizations or medication changes.  Medications: I have reviewed the patient's current medications.  Allergies:  Allergies  Allergen Reactions  . Demerol Other (See Comments)    Made patient feel like she was "out of it", not really aware of surroundings.  . Codeine Itching    All over the body    Past Medical History, Surgical history, Social history, and Family History were reviewed and updated.  Review of Systems: Constitutional:  Negative for fever, chills, night sweats, anorexia, weight loss, pain. Cardiovascular: no chest pain or dyspnea on exertion Respiratory: no cough, shortness of breath, or wheezing Neurological: no TIA or stroke symptoms Dermatological: negative ENT: negative Skin Gastrointestinal: no abdominal pain, change in bowel habits, or black or bloody stools Genito-Urinary: no dysuria, trouble voiding, or hematuria Hematological and Lymphatic: negative Breast: positive for lt breast discomfort Musculoskeletal: negative Remaining ROS negative.  Physical Exam: Blood pressure 144/73, pulse 72, temperature 97.5 F (36.4 C), resp. rate 20, height 5\' 5"  (1.651 m), weight 173 lb 6.4 oz (78.654 kg). ECOG:  General appearance: alert, cooperative and appears stated age Head: Normocephalic, without obvious abnormality, atraumatic Neck: no adenopathy, no carotid bruit, no JVD, supple, symmetrical, trachea midline and thyroid not enlarged, symmetric, no tenderness/mass/nodules Lymph nodes: Cervical, supraclavicular, and axillary nodes normal. Cardiac : regular rate and rhythm, no murmurs or gallops Pulmonary:clear to auscultation bilaterally and normal percussion  bilaterally Breasts: inspection negative, no nipple discharge or bleeding, no masses or nodularity palpable and negative apart from seroma in lt. breast which is stable. Abdomen:soft, non-tender; bowel sounds normal; no masses,  no organomegaly Extremities negative Neuro: alert, oriented, normal speech, no focal findings or movement disorder noted  Lab Results: Lab Results  Component Value Date   WBC 6.7 03/08/2012   HGB 13.6 03/08/2012   HCT 38.3 03/08/2012   MCV 90.7 03/08/2012   PLT 195 03/08/2012     Chemistry      Component Value Date/Time   NA 139 08/16/2011 1332   K 3.4* 08/16/2011 1332   CL 100 08/16/2011 1332   CO2 28 08/16/2011 1332   BUN 12 08/16/2011 1332   CREATININE 0.79 08/16/2011 1332      Component Value Date/Time   CALCIUM 9.2 08/16/2011 1332   ALKPHOS 68 08/16/2011 1332   AST 23 08/16/2011 1332   ALT 14 08/16/2011 1332   BILITOT 0.4 08/16/2011 1332      .pathology. Radiological Studies: chest X-ray n/a Mammogram  Bone density   Impression and Plan: Ms Wrubel is doing well, She is tolerating arimidex and having some joint pains which are chronic. I will see her in 6 months with a f/u bone density test. More than 50% of the visit was spent in patient-related counselling   Pierce Crane, MD 11/6/20131:14 PM

## 2012-03-09 LAB — CANCER ANTIGEN 27.29: CA 27.29: 31 U/mL (ref 0–39)

## 2012-07-12 ENCOUNTER — Other Ambulatory Visit: Payer: Self-pay | Admitting: *Deleted

## 2012-07-12 MED ORDER — ANASTROZOLE 1 MG PO TABS: 1.0000 mg | ORAL_TABLET | Freq: Every day | ORAL | Status: DC

## 2012-07-12 NOTE — Telephone Encounter (Signed)
Received call from patient stating she needed a refill on her Arimidex.  Rx filled and also was able to confirm a follow up appt. With Norina Buzzard on 09/08/12 at 10am.  Then will become Dr.Khan.

## 2012-07-13 ENCOUNTER — Other Ambulatory Visit: Payer: Self-pay | Admitting: *Deleted

## 2012-07-13 DIAGNOSIS — C50912 Malignant neoplasm of unspecified site of left female breast: Secondary | ICD-10-CM

## 2012-07-13 MED ORDER — ANASTROZOLE 1 MG PO TABS: 1.0000 mg | ORAL_TABLET | Freq: Every day | ORAL | Status: DC

## 2012-07-13 NOTE — Telephone Encounter (Signed)
Faxed to [facility] at [phone].

## 2012-09-08 ENCOUNTER — Telehealth: Payer: Self-pay | Admitting: *Deleted

## 2012-09-08 ENCOUNTER — Ambulatory Visit (HOSPITAL_BASED_OUTPATIENT_CLINIC_OR_DEPARTMENT_OTHER): Payer: Medicare Other | Admitting: Oncology

## 2012-09-08 ENCOUNTER — Encounter: Payer: Self-pay | Admitting: Oncology

## 2012-09-08 VITALS — BP 149/80 | HR 81 | Temp 97.7°F | Resp 20 | Ht 65.0 in | Wt 175.2 lb

## 2012-09-08 DIAGNOSIS — C50412 Malignant neoplasm of upper-outer quadrant of left female breast: Secondary | ICD-10-CM

## 2012-09-08 DIAGNOSIS — Z170 Estrogen receptor positive status [ER+]: Secondary | ICD-10-CM

## 2012-09-08 DIAGNOSIS — C50419 Malignant neoplasm of upper-outer quadrant of unspecified female breast: Secondary | ICD-10-CM

## 2012-09-08 NOTE — Progress Notes (Signed)
OFFICE PROGRESS NOTE  CC  RAMACHANDRAN,AJITH, MD [address] [place] [address]  DIAGNOSIS: 72 year old female with history of left breast cancer diagnosed 2010 treated with lumpectomy sentinel lymph node and MammoSite radiation.  PRIOR THERAPY:  #1 patient presented with an abnormal mammogram that revealed a mass in the left breast. She subsequently underwent a lumpectomy with MammoSite. Tumor was T1 C. N0. She completed MammoSite 02/25/2009. She thereafter began tamoxifen 20 mg daily.  #2 patient was then switched to Arimidex 1 mg daily in April 2013 which she is tolerating so far without any significant problems.  CURRENT THERAPY: Arimidex 1 mg daily  INTERVAL HISTORY: Miranda Zavala 72 y.o. female returns for followup visit. Overall she's doing well without any complaints. She denies any fevers chills night sweats headaches shortness of breath or chest pains. Her left breast site is still tender and hard. She's not had any joint pain or aches. Remainder of the 10 point review of systems is negative.  MEDICAL HISTORY: Past Medical History  Diagnosis Date  . Arthritis   . Fibromyalgia   . Cancer     breast  . GERD (gastroesophageal reflux disease)   . Hyperlipidemia   . Hypertension   . 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 Hx of radiation therapy sept -oct 2010    mammosite    ALLERGIES:  is allergic to demerol and codeine.  MEDICATIONS:  Current Outpatient Prescriptions  Medication Sig Dispense Refill  . anastrozole (ARIMIDEX) 1 MG tablet Take 1 tablet (1 mg total) by mouth daily.  90 tablet  3  . CALCIUM PO Take by mouth daily.      . Cholecalciferol (VITAMIN D PO) Take 1,000 mg by mouth daily.        . clobetasol (TEMOVATE) 0.05 % ointment 1 application as needed.       . gabapentin (NEURONTIN) 300 MG capsule       . hydrochlorothiazide (HYDRODIURIL) 25 MG tablet 25 mg daily.       Marland Kitchen  losartan (COZAAR) 100 MG tablet 100 mg daily.       Marland Kitchen LOVASTATIN PO Take 40 mg by mouth daily.        . meloxicam (MOBIC) 7.5 MG tablet       . omeprazole (PRILOSEC) 40 MG capsule Take 40 mg by mouth as needed.        . potassium chloride SA (K-DUR,KLOR-CON) 20 MEQ tablet 20 mEq daily.       . timolol (TIMOPTIC) 0.5 % ophthalmic solution        No current facility-administered medications for this visit.    SURGICAL HISTORY:  Past Surgical History  Procedure Laterality Date  . Back surgery      Patient does not remember date.  . Neck surgery      Patient does not remember date.  . Shoulder surgery      Patient does not remember date.  . Breast surgery  2010  . Abdominal hysterectomy      Patient does not remember date    REVIEW OF SYSTEMS:  Pertinent items are noted in HPI.   HEALTH MAINTENANCE:  PHYSICAL EXAMINATION: Blood pressure 149/80, pulse 81, temperature 97.7 F (36.5 C), temperature source Oral, resp. rate 20, height 5\' 5"  (1.651 m), weight 175 lb 3.2 oz (79.47 kg). Body mass index is 29.15 kg/(m^2). ECOG PERFORMANCE STATUS: 0 - Asymptomatic   Patient is awake alert  in no acute distress HEENT exam EOMI PERRLA sclerae anicteric no conjunctival pallor oral mucosa is moist neck is supple lungs are clear cardiovascular is regular rate rhythm abdomen is soft nontender no HSM extremities no edema neuro patient's alert oriented otherwise nonfocal Left breast healing surgical scar there is still a not noted at the MammoSite site this is slightly tender. Right breast no masses or nipple discharge.   LABORATORY DATA: Lab Results  Component Value Date   WBC 6.7 03/08/2012   HGB 13.6 03/08/2012   HCT 38.3 03/08/2012   MCV 90.7 03/08/2012   PLT 195 03/08/2012      Chemistry      Component Value Date/Time   NA 136 03/08/2012 1236   NA 139 08/16/2011 1332   K 3.4* 03/08/2012 1236   K 3.4* 08/16/2011 1332   CL 101 03/08/2012 1236   CL 100 08/16/2011 1332   CO2 29 03/08/2012  1236   CO2 28 08/16/2011 1332   BUN 13.0 03/08/2012 1236   BUN 12 08/16/2011 1332   CREATININE 0.8 03/08/2012 1236   CREATININE 0.79 08/16/2011 1332      Component Value Date/Time   CALCIUM 9.6 03/08/2012 1236   CALCIUM 9.2 08/16/2011 1332   ALKPHOS 85 03/08/2012 1236   ALKPHOS 68 08/16/2011 1332   AST 27 03/08/2012 1236   AST 23 08/16/2011 1332   ALT 21 03/08/2012 1236   ALT 14 08/16/2011 1332   BILITOT 0.57 03/08/2012 1236   BILITOT 0.4 08/16/2011 1332       RADIOGRAPHIC STUDIES:  No results found.  ASSESSMENT: 72 year old female with  #1 history of invasive ductal carcinoma of the left breast status post lumpectomy with sentinel lymph node biopsy for a T1 C. N0 disease. She underwent MammoSite radiotherapy followed by tamoxifen for 2 years. Thereafter she was switched to Arimidex 1 mg daily which she is tolerating well.   PLAN:   #1 patient will continue Arimidex 1 mg daily.  #2 we discussed obtaining a bone density scan and I have ordered this.  #3 I will see her back in 6 months time.   All questions were answered. The patient knows to call the clinic with any problems, questions or concerns. We can certainly see the patient much sooner if necessary.  I spent 25 minutes counseling the patient face to face. The total time spent in the appointment was 30 minutes.    Drue Second, MD Medical/Oncology [facility] [phone] (beeper) [phone] (Office)  09/08/2012, 11:01 AM

## 2012-09-08 NOTE — Telephone Encounter (Signed)
appts made and printed...td 

## 2012-09-08 NOTE — Patient Instructions (Addendum)
Continue arimidex 1 mg daily  I have ordered bone density scan  I will see you back in 6 months

## 2012-09-12 ENCOUNTER — Ambulatory Visit
Admission: RE | Admit: 2012-09-12 | Discharge: 2012-09-12 | Disposition: A | Discharge status: Home / Self Care | Payer: Medicare Other | Source: Ambulatory Visit | Attending: Oncology | Admitting: Oncology

## 2012-09-12 DIAGNOSIS — C50412 Malignant neoplasm of upper-outer quadrant of left female breast: Secondary | ICD-10-CM

## 2012-10-27 ENCOUNTER — Other Ambulatory Visit: Payer: Self-pay | Admitting: Family Medicine

## 2012-10-27 DIAGNOSIS — M25511 Pain in right shoulder: Secondary | ICD-10-CM

## 2012-10-27 DIAGNOSIS — M25561 Pain in right knee: Secondary | ICD-10-CM

## 2012-11-07 ENCOUNTER — Ambulatory Visit
Admission: RE | Admit: 2012-11-07 | Discharge: 2012-11-07 | Disposition: A | Discharge status: Home / Self Care | Payer: Self-pay | Source: Ambulatory Visit | Attending: Family Medicine | Admitting: Family Medicine

## 2012-11-07 DIAGNOSIS — M25561 Pain in right knee: Secondary | ICD-10-CM

## 2012-11-07 DIAGNOSIS — M25511 Pain in right shoulder: Secondary | ICD-10-CM

## 2013-02-09 ENCOUNTER — Other Ambulatory Visit: Payer: Self-pay | Admitting: Oncology

## 2013-02-09 DIAGNOSIS — Z853 Personal history of malignant neoplasm of breast: Secondary | ICD-10-CM

## 2013-02-26 ENCOUNTER — Telehealth: Payer: Self-pay | Admitting: Oncology

## 2013-02-26 NOTE — Telephone Encounter (Signed)
, °

## 2013-03-13 ENCOUNTER — Ambulatory Visit (HOSPITAL_BASED_OUTPATIENT_CLINIC_OR_DEPARTMENT_OTHER): Payer: Medicare Other | Admitting: Oncology

## 2013-03-13 ENCOUNTER — Other Ambulatory Visit (HOSPITAL_BASED_OUTPATIENT_CLINIC_OR_DEPARTMENT_OTHER): Payer: Medicare Other | Admitting: Lab

## 2013-03-13 ENCOUNTER — Ambulatory Visit
Admission: RE | Admit: 2013-03-13 | Discharge: 2013-03-13 | Disposition: A | Discharge status: Home / Self Care | Payer: Medicare Other | Source: Ambulatory Visit | Attending: Oncology | Admitting: Oncology

## 2013-03-13 ENCOUNTER — Telehealth: Payer: Self-pay | Admitting: Oncology

## 2013-03-13 ENCOUNTER — Encounter: Payer: Self-pay | Admitting: Oncology

## 2013-03-13 VITALS — BP 151/74 | HR 89 | Temp 98.2°F | Resp 20 | Ht 65.0 in | Wt 169.6 lb

## 2013-03-13 DIAGNOSIS — Z853 Personal history of malignant neoplasm of breast: Secondary | ICD-10-CM

## 2013-03-13 DIAGNOSIS — C50412 Malignant neoplasm of upper-outer quadrant of left female breast: Secondary | ICD-10-CM

## 2013-03-13 DIAGNOSIS — Z170 Estrogen receptor positive status [ER+]: Secondary | ICD-10-CM

## 2013-03-13 DIAGNOSIS — C50419 Malignant neoplasm of upper-outer quadrant of unspecified female breast: Secondary | ICD-10-CM

## 2013-03-13 LAB — COMPREHENSIVE METABOLIC PANEL (CC13)
Anion Gap: 10 mEq/L (ref 3–11)
BUN: 13.2 mg/dL (ref 7.0–26.0)
CO2: 25 mEq/L (ref 22–29)
Calcium: 10.1 mg/dL (ref 8.4–10.4)
Chloride: 102 mEq/L (ref 98–109)
Creatinine: 0.8 mg/dL (ref 0.6–1.1)
Total Bilirubin: 0.67 mg/dL (ref 0.20–1.20)

## 2013-03-13 LAB — CBC WITH DIFFERENTIAL/PLATELET
BASO%: 0.7 % (ref 0.0–2.0)
HCT: 40.6 % (ref 34.8–46.6)
LYMPH%: 25.5 % (ref 14.0–49.7)
MCHC: 34.9 g/dL (ref 31.5–36.0)
MCV: 89.3 fL (ref 79.5–101.0)
MONO#: 0.6 10*3/uL (ref 0.1–0.9)
MONO%: 8.4 % (ref 0.0–14.0)
NEUT%: 60.2 % (ref 38.4–76.8)
Platelets: 230 10*3/uL (ref 145–400)
WBC: 7.2 10*3/uL (ref 3.9–10.3)

## 2013-03-13 NOTE — Telephone Encounter (Signed)
, °

## 2013-03-13 NOTE — Patient Instructions (Addendum)
#  1 continue Arimidex 1 mg daily.  #2 we discussed the results of the bone density scan.  #3 I will see you back in 6 months time for followup

## 2013-03-13 NOTE — Progress Notes (Signed)
OFFICE PROGRESS NOTE  CC  RAMACHANDRAN,AJITH, MD [address] [place] [address]  DIAGNOSIS: 72 year old female with history of left breast cancer diagnosed 2010 treated with lumpectomy sentinel lymph node and MammoSite radiation.  PRIOR THERAPY:  #1 patient presented with an abnormal mammogram that revealed a mass in the left breast. She subsequently underwent a lumpectomy with MammoSite. Tumor was T1 C. N0. She completed MammoSite 02/25/2009. She thereafter began tamoxifen 20 mg daily.  #2 patient was then switched to Arimidex 1 mg daily in April 2013 which she is tolerating so far without any significant problems.  CURRENT THERAPY: Arimidex 1 mg daily  INTERVAL HISTORY: Miranda Zavala 72 y.o. female returns for followup visit. Overall she's doing well without any complaints. She denies any fevers chills night sweats headaches shortness of breath or chest pains. Her left breast site is still tender and hard. She's not had any joint pain or aches. Remainder of the 10 point review of systems is negative.  MEDICAL HISTORY: Past Medical History  Diagnosis Date  . Arthritis   . Fibromyalgia   . Cancer     breast  . GERD (gastroesophageal reflux disease)   . Hyperlipidemia   . Hypertension   . 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 Hx of radiation therapy sept -oct 2010    mammosite    ALLERGIES:  is allergic to demerol and codeine.  MEDICATIONS:  Current Outpatient Prescriptions  Medication Sig Dispense Refill  . anastrozole (ARIMIDEX) 1 MG tablet Take 1 tablet (1 mg total) by mouth daily.  90 tablet  3  . CALCIUM PO Take by mouth daily.      . Cholecalciferol (VITAMIN D PO) Take 1,000 mg by mouth daily.        . clobetasol (TEMOVATE) 0.05 % ointment 1 application as needed.       . gabapentin (NEURONTIN) 300 MG capsule       . hydrochlorothiazide (HYDRODIURIL) 25 MG tablet 25 mg daily.       Marland Kitchen  losartan (COZAAR) 100 MG tablet 100 mg daily.       Marland Kitchen LOVASTATIN PO Take 40 mg by mouth daily.        . meloxicam (MOBIC) 7.5 MG tablet       . omeprazole (PRILOSEC) 40 MG capsule Take 40 mg by mouth as needed.        . potassium chloride SA (K-DUR,KLOR-CON) 20 MEQ tablet 20 mEq daily.       . timolol (TIMOPTIC) 0.5 % ophthalmic solution        No current facility-administered medications for this visit.    SURGICAL HISTORY:  Past Surgical History  Procedure Laterality Date  . Back surgery      Patient does not remember date.  . Neck surgery      Patient does not remember date.  . Shoulder surgery      Patient does not remember date.  . Breast surgery  2010  . Abdominal hysterectomy      Patient does not remember date    REVIEW OF SYSTEMS:  Pertinent items are noted in HPI.   HEALTH MAINTENANCE:  PHYSICAL EXAMINATION: Blood pressure 151/74, pulse 89, temperature 98.2 F (36.8 C), temperature source Oral, resp. rate 20, height 5\' 5"  (1.651 m), weight 169 lb 9.6 oz (76.93 kg). Body mass index is 28.22 kg/(m^2). ECOG PERFORMANCE STATUS: 0 - Asymptomatic   Patient is awake alert  in no acute distress HEENT exam EOMI PERRLA sclerae anicteric no conjunctival pallor oral mucosa is moist neck is supple lungs are clear cardiovascular is regular rate rhythm abdomen is soft nontender no HSM extremities no edema neuro patient's alert oriented otherwise nonfocal Left breast healing surgical scar there is still a not noted at the MammoSite site this is slightly tender. Right breast no masses or nipple discharge.   LABORATORY DATA: Lab Results  Component Value Date   WBC 7.2 03/13/2013   HGB 14.2 03/13/2013   HCT 40.6 03/13/2013   MCV 89.3 03/13/2013   PLT 230 03/13/2013      Chemistry      Component Value Date/Time   NA 138 03/13/2013 1255   NA 139 08/16/2011 1332   K 3.7 03/13/2013 1255   K 3.4* 08/16/2011 1332   CL 101 03/08/2012 1236   CL 100 08/16/2011 1332   CO2 25  03/13/2013 1255   CO2 28 08/16/2011 1332   BUN 13.2 03/13/2013 1255   BUN 12 08/16/2011 1332   CREATININE 0.8 03/13/2013 1255   CREATININE 0.79 08/16/2011 1332      Component Value Date/Time   CALCIUM 10.1 03/13/2013 1255   CALCIUM 9.2 08/16/2011 1332   ALKPHOS 104 03/13/2013 1255   ALKPHOS 68 08/16/2011 1332   AST 21 03/13/2013 1255   AST 23 08/16/2011 1332   ALT 12 03/13/2013 1255   ALT 14 08/16/2011 1332   BILITOT 0.67 03/13/2013 1255   BILITOT 0.4 08/16/2011 1332       RADIOGRAPHIC STUDIES:  No results found.  ASSESSMENT: 72 year old female with  #1 history of invasive ductal carcinoma of the left breast status post lumpectomy with sentinel lymph node biopsy for a T1 C. N0 disease. She underwent MammoSite radiotherapy followed by tamoxifen for 2 years. Thereafter she was switched to Arimidex 1 mg daily which she is tolerating well.   PLAN:   #1 patient will continue Arimidex 1 mg daily.  #2 we discussed the results of bone density scan and they were normal. She will need another bone density in 2 years time.  #3 I will see her back in 6 months time.   All questions were answered. The patient knows to call the clinic with any problems, questions or concerns. We can certainly see the patient much sooner if necessary.  I spent 15 minutes counseling the patient face to face. The total time spent in the appointment was 20 minutes.    Drue Second, MD Medical/Oncology [facility] [phone] (beeper) [phone] (Office)  03/13/2013, 1:57 PM

## 2013-03-16 ENCOUNTER — Ambulatory Visit: Payer: Medicare Other | Admitting: Oncology

## 2013-03-16 ENCOUNTER — Other Ambulatory Visit: Payer: Medicare Other | Admitting: Lab

## 2013-05-08 ENCOUNTER — Other Ambulatory Visit: Payer: Self-pay | Admitting: *Deleted

## 2013-05-08 DIAGNOSIS — C50912 Malignant neoplasm of unspecified site of left female breast: Secondary | ICD-10-CM

## 2013-05-08 MED ORDER — ANASTROZOLE 1 MG PO TABS: 1.0000 mg | ORAL_TABLET | Freq: Every day | ORAL | Status: DC

## 2013-06-22 ENCOUNTER — Telehealth: Payer: Self-pay | Admitting: Oncology

## 2013-06-22 NOTE — Telephone Encounter (Signed)
, °

## 2013-09-18 ENCOUNTER — Telehealth: Payer: Self-pay | Admitting: Oncology

## 2013-09-18 NOTE — Telephone Encounter (Signed)
, °

## 2013-09-24 ENCOUNTER — Ambulatory Visit: Payer: Medicare Other | Admitting: Oncology

## 2013-09-24 ENCOUNTER — Other Ambulatory Visit: Payer: Medicare Other

## 2013-10-05 ENCOUNTER — Other Ambulatory Visit: Payer: Self-pay

## 2013-10-05 ENCOUNTER — Ambulatory Visit: Payer: Self-pay | Admitting: Oncology

## 2013-11-06 ENCOUNTER — Other Ambulatory Visit: Payer: Self-pay

## 2013-11-06 DIAGNOSIS — C50912 Malignant neoplasm of unspecified site of left female breast: Secondary | ICD-10-CM

## 2013-11-06 MED ORDER — ANASTROZOLE 1 MG PO TABS: 1.0000 mg | ORAL_TABLET | Freq: Every day | ORAL | Status: DC

## 2013-11-06 NOTE — Telephone Encounter (Signed)
6 mo follow up.  Seeing [facility] 4/13.   Per pt message refilled anastrozole to [facility].  Let pt know - she voiced understanding.

## 2013-11-08 ENCOUNTER — Telehealth: Payer: Self-pay | Admitting: Oncology

## 2013-11-08 NOTE — Telephone Encounter (Signed)
, °

## 2013-11-12 ENCOUNTER — Other Ambulatory Visit: Payer: Self-pay

## 2013-11-12 ENCOUNTER — Ambulatory Visit: Payer: Self-pay | Admitting: Oncology

## 2013-11-19 ENCOUNTER — Encounter: Payer: Self-pay | Admitting: Oncology

## 2013-11-19 ENCOUNTER — Ambulatory Visit (HOSPITAL_BASED_OUTPATIENT_CLINIC_OR_DEPARTMENT_OTHER): Payer: Medicare Other | Admitting: Oncology

## 2013-11-19 ENCOUNTER — Other Ambulatory Visit (HOSPITAL_BASED_OUTPATIENT_CLINIC_OR_DEPARTMENT_OTHER): Payer: Medicare Other

## 2013-11-19 VITALS — BP 143/65 | HR 81 | Temp 97.7°F | Resp 18 | Ht 65.0 in | Wt 154.8 lb

## 2013-11-19 DIAGNOSIS — C50412 Malignant neoplasm of upper-outer quadrant of left female breast: Secondary | ICD-10-CM

## 2013-11-19 DIAGNOSIS — Z853 Personal history of malignant neoplasm of breast: Secondary | ICD-10-CM

## 2013-11-19 LAB — CBC WITH DIFFERENTIAL/PLATELET
BASO%: 1.1 % (ref 0.0–2.0)
Basophils Absolute: 0.1 10*3/uL (ref 0.0–0.1)
EOS%: 5.1 % (ref 0.0–7.0)
Eosinophils Absolute: 0.3 10*3/uL (ref 0.0–0.5)
HEMATOCRIT: 39.7 % (ref 34.8–46.6)
HGB: 13.6 g/dL (ref 11.6–15.9)
LYMPH%: 24.7 % (ref 14.0–49.7)
MCH: 31.6 pg (ref 25.1–34.0)
MCHC: 34.2 g/dL (ref 31.5–36.0)
MCV: 92.2 fL (ref 79.5–101.0)
MONO#: 0.5 10*3/uL (ref 0.1–0.9)
MONO%: 7.8 % (ref 0.0–14.0)
NEUT#: 4.1 10*3/uL (ref 1.5–6.5)
NEUT%: 61.3 % (ref 38.4–76.8)
PLATELETS: 197 10*3/uL (ref 145–400)
RBC: 4.31 10*6/uL (ref 3.70–5.45)
RDW: 12.9 % (ref 11.2–14.5)
WBC: 6.6 10*3/uL (ref 3.9–10.3)
lymph#: 1.6 10*3/uL (ref 0.9–3.3)

## 2013-11-19 LAB — COMPREHENSIVE METABOLIC PANEL (CC13)
ALK PHOS: 94 U/L (ref 40–150)
ALT: 13 U/L (ref 0–55)
AST: 22 U/L (ref 5–34)
Albumin: 3.8 g/dL (ref 3.5–5.0)
Anion Gap: 9 mEq/L (ref 3–11)
BILIRUBIN TOTAL: 0.65 mg/dL (ref 0.20–1.20)
BUN: 13.8 mg/dL (ref 7.0–26.0)
CO2: 30 mEq/L — ABNORMAL HIGH (ref 22–29)
CREATININE: 1 mg/dL (ref 0.6–1.1)
Calcium: 9.9 mg/dL (ref 8.4–10.4)
Chloride: 101 mEq/L (ref 98–109)
Glucose: 102 mg/dl (ref 70–140)
Potassium: 3.4 mEq/L — ABNORMAL LOW (ref 3.5–5.1)
SODIUM: 139 meq/L (ref 136–145)
TOTAL PROTEIN: 6.7 g/dL (ref 6.4–8.3)

## 2013-11-19 NOTE — Progress Notes (Signed)
OFFICE PROGRESS NOTE  CC  RAMACHANDRAN,AJITH, MD [address] [place] [address]  DIAGNOSIS: 73 year old female with history of left breast cancer diagnosed 2010 treated with lumpectomy sentinel lymph node and MammoSite radiation.  PRIOR THERAPY:  #1 patient presented with an abnormal mammogram that revealed a mass in the left breast. She subsequently underwent a lumpectomy with MammoSite. Tumor was T1 C. N0. She completed MammoSite 02/25/2009. She thereafter began tamoxifen 20 mg daily.  #2 patient was then switched to Arimidex 1 mg daily in April 2013 which she is tolerating so far without any significant problems.  CURRENT THERAPY: Arimidex 1 mg daily  INTERVAL HISTORY: Miranda Zavala 73 y.o. female returns for followup visit. Overall she's doing well without any complaints. She denies any fevers chills night sweats headaches shortness of breath or chest pains. Her left breast site is still tender and hard. She has had some leg pain off and on due to her fibromyalgia. Remainder of the 10 point review of systems is negative.  MEDICAL HISTORY: Past Medical History  Diagnosis Date  . Arthritis   . Fibromyalgia   . Cancer     breast  . GERD (gastroesophageal reflux disease)   . Hyperlipidemia   . Hypertension   . 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 Hx of radiation therapy sept -oct 2010    mammosite    ALLERGIES:  is allergic to demerol and codeine.  MEDICATIONS:  Current Outpatient Prescriptions  Medication Sig Dispense Refill  . anastrozole (ARIMIDEX) 1 MG tablet Take 1 tablet (1 mg total) by mouth daily.  90 tablet  1  . CALCIUM PO Take by mouth daily.      . Cholecalciferol (VITAMIN D PO) Take 1,000 mg by mouth daily.        . clobetasol (TEMOVATE) 0.05 % ointment 1 application as needed.       . gabapentin (NEURONTIN) 300 MG capsule       . hydrochlorothiazide (HYDRODIURIL) 25 MG tablet 25  mg daily.       Marland Kitchen losartan (COZAAR) 100 MG tablet 100 mg daily.       Marland Kitchen LOVASTATIN PO Take 40 mg by mouth daily.        . meloxicam (MOBIC) 7.5 MG tablet       . omeprazole (PRILOSEC) 40 MG capsule Take 40 mg by mouth as needed.        . potassium chloride SA (K-DUR,KLOR-CON) 20 MEQ tablet 20 mEq daily.       . timolol (TIMOPTIC) 0.5 % ophthalmic solution       . traMADol (ULTRAM) 50 MG tablet Take 50 mg by mouth at bedtime and may repeat dose one time if needed.       No current facility-administered medications for this visit.    SURGICAL HISTORY:  Past Surgical History  Procedure Laterality Date  . Back surgery      Patient does not remember date.  . Neck surgery      Patient does not remember date.  . Shoulder surgery      Patient does not remember date.  . Breast surgery  2010  . Abdominal hysterectomy      Patient does not remember date    REVIEW OF SYSTEMS:  Pertinent items are noted in HPI.   HEALTH MAINTENANCE:  PHYSICAL EXAMINATION: Blood pressure 143/65, pulse 81, temperature 97.7 F (36.5 C), temperature source Oral, resp. rate  18, height 5\' 5"  (1.651 m), weight 154 lb 12.8 oz (70.217 kg). Body mass index is 25.76 kg/(m^2). ECOG PERFORMANCE STATUS: 0 - Asymptomatic   Patient is awake alert in no acute distress HEENT exam EOMI PERRLA sclerae anicteric no conjunctival pallor oral mucosa is moist neck is supple lungs are clear cardiovascular is regular rate rhythm abdomen is soft nontender no HSM extremities no edema neuro patient's alert oriented otherwise nonfocal Left breast healing surgical scar there is still a not noted at the MammoSite site this is slightly tender. Right breast no masses or nipple discharge.   LABORATORY DATA: Lab Results  Component Value Date   WBC 6.6 11/19/2013   HGB 13.6 11/19/2013   HCT 39.7 11/19/2013   MCV 92.2 11/19/2013   PLT 197 11/19/2013      Chemistry      Component Value Date/Time   NA 139 11/19/2013 1323   NA 139  08/16/2011 1332   K 3.4* 11/19/2013 1323   K 3.4* 08/16/2011 1332   CL 101 03/08/2012 1236   CL 100 08/16/2011 1332   CO2 30* 11/19/2013 1323   CO2 28 08/16/2011 1332   BUN 13.8 11/19/2013 1323   BUN 12 08/16/2011 1332   CREATININE 1.0 11/19/2013 1323   CREATININE 0.79 08/16/2011 1332      Component Value Date/Time   CALCIUM 9.9 11/19/2013 1323   CALCIUM 9.2 08/16/2011 1332   ALKPHOS 94 11/19/2013 1323   ALKPHOS 68 08/16/2011 1332   AST 22 11/19/2013 1323   AST 23 08/16/2011 1332   ALT 13 11/19/2013 1323   ALT 14 08/16/2011 1332   BILITOT 0.65 11/19/2013 1323   BILITOT 0.4 08/16/2011 1332       RADIOGRAPHIC STUDIES:  No results found.  ASSESSMENT: 73 year old female with  #1 history of invasive ductal carcinoma of the left breast status post lumpectomy with sentinel lymph node biopsy for a T1 C. N0 disease. She underwent MammoSite radiotherapy followed by tamoxifen for 2 years. Thereafter she was switched to Arimidex 1 mg daily which she is tolerating well.   PLAN:   #1 patient will continue Arimidex 1 mg daily until 02/25/14 and then stop it as she has completed 5 years of therapy. I have gone ahead and requested a mammogram for her this November 2015 when it is due.  #2 Last DEXA was normal in 08/2012. She will need another bone density in 2 years time.  #3 Since she has now completed all of her therapy, she will follow-up here PRN. She will continue to have her annual mammogram which can be ordered by her PCP in the future. SHe may be referred back to [place] if needed.    All questions were answered. The patient knows to call the clinic with any problems, questions or concerns.   I spent 15 minutes counseling the patient face to face. The total time spent in the appointment was 20 minutes.    Mikey Bussing, DNP, AGPCNP-BC  11/19/2013, 3:54 PM

## 2013-11-21 ENCOUNTER — Telehealth: Payer: Self-pay | Admitting: Oncology

## 2013-11-21 NOTE — Telephone Encounter (Signed)
S/w pt gave appt for mammo at the [facility] on 11/12 @ 11.15am.

## 2014-03-13 ENCOUNTER — Encounter: Payer: Self-pay | Admitting: Internal Medicine

## 2014-03-14 ENCOUNTER — Ambulatory Visit
Admission: RE | Admit: 2014-03-14 | Discharge: 2014-03-14 | Disposition: A | Discharge status: Home / Self Care | Payer: Medicare Other | Source: Ambulatory Visit | Attending: Oncology | Admitting: Oncology

## 2014-03-14 DIAGNOSIS — C50412 Malignant neoplasm of upper-outer quadrant of left female breast: Secondary | ICD-10-CM

## 2014-05-09 ENCOUNTER — Ambulatory Visit (AMBULATORY_SURGERY_CENTER): Payer: Self-pay | Admitting: *Deleted

## 2014-05-09 VITALS — Ht 64.0 in | Wt 146.2 lb

## 2014-05-09 DIAGNOSIS — Z1211 Encounter for screening for malignant neoplasm of colon: Secondary | ICD-10-CM

## 2014-05-09 NOTE — Progress Notes (Signed)
No egg or soy allergy  No anesthesia or intubation problems per pt  No diet medications taken  Registered in [place] sample given

## 2014-05-10 ENCOUNTER — Encounter: Payer: Self-pay | Admitting: Internal Medicine

## 2014-05-23 ENCOUNTER — Telehealth: Payer: Self-pay | Admitting: Internal Medicine

## 2014-05-23 ENCOUNTER — Encounter: Payer: Self-pay | Admitting: Internal Medicine

## 2014-05-23 ENCOUNTER — Ambulatory Visit (AMBULATORY_SURGERY_CENTER): Payer: Medicare Other | Admitting: Internal Medicine

## 2014-05-23 VITALS — BP 138/60 | HR 71 | Temp 96.9°F | Resp 26 | Ht 64.0 in | Wt 146.0 lb

## 2014-05-23 DIAGNOSIS — D120 Benign neoplasm of cecum: Secondary | ICD-10-CM

## 2014-05-23 DIAGNOSIS — Z1211 Encounter for screening for malignant neoplasm of colon: Secondary | ICD-10-CM

## 2014-05-23 MED ORDER — SODIUM CHLORIDE 0.9 % IV SOLN: 500.0000 mL | INTRAVENOUS | Status: DC

## 2014-05-23 NOTE — Patient Instructions (Signed)

## 2014-05-23 NOTE — Progress Notes (Signed)
Called to room to assist during endoscopic procedure.  Patient ID and intended procedure confirmed with present staff. Received instructions for my participation in the procedure from the performing physician.  

## 2014-05-23 NOTE — Telephone Encounter (Signed)
Returned phone call to pt.  She stated bilateral wrist began tingling around 13:30 after she got home.  She said the tingling has radiated to bilateral elbows.  The pt said she has had a history of tingling in her wrists, but never had it radiate to her elbows.  I called Dr. Blanch Media office and he was not available.  I spoke with Dr. Hilarie Fredrickson, as he was still in the endoscopy unit,  He advised this was not a typical reaction from the coloscopy or the anesthesia.  He recommended to hydrate herself well.  We spoke about her eletrolytes.  Pt said she had eaten but should drink more fluids.  Also Dr. Hilarie Fredrickson advised if this does not improve to contact her physician or go to urgent care facility.  Pt states she will.  She thanked me for returning her call. maw

## 2014-05-23 NOTE — Op Note (Addendum)
[place]  [address] [place], [address]   COLONOSCOPY PROCEDURE REPORT  PATIENT: Miranda, Zavala  MR#: [id number] BIRTHDATE: 1940/06/01 , 57  yrs. old GENDER: female ENDOSCOPIST: Eustace Quail, MD REFERRED YH:[id number] Recall, PROCEDURE DATE:  05/23/2014 PROCEDURE:   Colonoscopy with snare polypectomy x 3 First Screening Colonoscopy - Avg.  risk and is 50 yrs.  old or older - No.  Prior Negative Screening - Now for repeat screening. 10 or more years since last screening  History of Adenoma - Now for follow-up colonoscopy & has been > or = to 3 yrs.  N/A  Polyps Removed Today? Yes. ASA CLASS:   Class II INDICATIONS:average risk for colorectal cancer.   . Index exam 2005 MEDICATIONS: Monitored anesthesia care and Propofol 200 mg IV  DESCRIPTION OF PROCEDURE:   After the risks benefits and alternatives of the procedure were thoroughly explained, informed consent was obtained.  The digital rectal exam revealed no abnormalities of the rectum.   The [id number]  endoscope was introduced through the anus and advanced to the cecum, which was identified by both the appendix and ileocecal valve. No adverse events experienced.   The quality of the prep was excellent, using MoviPrep  The instrument was then slowly withdrawn as the colon was fully examined.  COLON FINDINGS: A pedunculated polyp measuring 10 mm in size was found at the cecum.  A polypectomy was performed using snare cautery.  The resection was complete, the polyp tissue was completely retrieved and sent to histology.   There was moderate diverticulosis noted throughout the entire examined colon.   The examination was otherwise normal.  Retroflexed views revealed no abnormalities. The time to cecum=4 minutes 15 seconds.  Withdrawal time=12 minutes 06 seconds.  The scope was withdrawn and the procedure completed. COMPLICATIONS: There were no immediate complications.  ENDOSCOPIC  IMPRESSION: 1.   Pedunculated polyp measuring 10 mm in size was found at the cecum; polypectomy was performed using snare cautery 2.   Moderate diverticulosis was noted throughout the entire examined colon 3.   The examination was otherwise normal  RECOMMENDATIONS: 1. Repeat Colonoscopy in 3 years.  eSigned:  Eustace Quail, MD 05/23/2014 10:21 AM   cc: Isaiah Blakes MD and The Patient

## 2014-05-27 ENCOUNTER — Telehealth: Payer: Self-pay | Admitting: *Deleted

## 2014-05-27 NOTE — Telephone Encounter (Signed)
  Follow up Call-  Call back number 05/23/2014  Post procedure Call Back phone  # [phone]  Permission to leave phone message Yes     Patient questions:  Do you have a fever, pain , or abdominal swelling? No. Pain Score  0 *  Have you tolerated food without any problems? Yes.    Have you been able to return to your normal activities? Yes.    Do you have any questions about your discharge instructions: Diet   No. Medications  No. Follow up visit  No.  Do you have questions or concerns about your Care? No.  Actions: * If pain score is 4 or above: No action needed, pain <4.

## 2014-05-30 ENCOUNTER — Encounter: Payer: Self-pay | Admitting: Internal Medicine

## 2014-08-16 ENCOUNTER — Other Ambulatory Visit: Payer: Self-pay | Admitting: Dermatology

## 2015-02-17 ENCOUNTER — Other Ambulatory Visit: Payer: Self-pay | Admitting: Oncology

## 2015-02-17 DIAGNOSIS — Z853 Personal history of malignant neoplasm of breast: Secondary | ICD-10-CM

## 2015-02-18 ENCOUNTER — Other Ambulatory Visit: Payer: Self-pay | Admitting: Orthopedic Surgery

## 2015-02-18 DIAGNOSIS — M542 Cervicalgia: Secondary | ICD-10-CM

## 2015-03-08 ENCOUNTER — Ambulatory Visit
Admission: RE | Admit: 2015-03-08 | Discharge: 2015-03-08 | Disposition: A | Discharge status: Home / Self Care | Payer: Medicare Other | Source: Ambulatory Visit | Attending: Orthopedic Surgery | Admitting: Orthopedic Surgery

## 2015-03-08 DIAGNOSIS — M542 Cervicalgia: Secondary | ICD-10-CM

## 2015-03-13 ENCOUNTER — Other Ambulatory Visit: Payer: Self-pay | Admitting: Oncology

## 2015-03-13 ENCOUNTER — Other Ambulatory Visit: Payer: Self-pay

## 2015-03-13 DIAGNOSIS — Z853 Personal history of malignant neoplasm of breast: Secondary | ICD-10-CM

## 2015-03-18 ENCOUNTER — Ambulatory Visit
Admission: RE | Admit: 2015-03-18 | Discharge: 2015-03-18 | Disposition: A | Discharge status: Home / Self Care | Payer: Medicare Other | Source: Ambulatory Visit | Attending: Oncology | Admitting: Oncology

## 2015-03-18 DIAGNOSIS — Z853 Personal history of malignant neoplasm of breast: Secondary | ICD-10-CM

## 2015-05-21 DIAGNOSIS — M797 Fibromyalgia: Secondary | ICD-10-CM | POA: Diagnosis not present

## 2015-05-21 DIAGNOSIS — I10 Essential (primary) hypertension: Secondary | ICD-10-CM | POA: Diagnosis not present

## 2015-05-21 DIAGNOSIS — E782 Mixed hyperlipidemia: Secondary | ICD-10-CM | POA: Diagnosis not present

## 2015-05-28 DIAGNOSIS — E782 Mixed hyperlipidemia: Secondary | ICD-10-CM | POA: Diagnosis not present

## 2015-05-28 DIAGNOSIS — M150 Primary generalized (osteo)arthritis: Secondary | ICD-10-CM | POA: Diagnosis not present

## 2015-05-28 DIAGNOSIS — I10 Essential (primary) hypertension: Secondary | ICD-10-CM | POA: Diagnosis not present

## 2015-05-28 DIAGNOSIS — K219 Gastro-esophageal reflux disease without esophagitis: Secondary | ICD-10-CM | POA: Diagnosis not present

## 2015-07-24 DIAGNOSIS — M1612 Unilateral primary osteoarthritis, left hip: Secondary | ICD-10-CM | POA: Diagnosis not present

## 2015-07-24 DIAGNOSIS — M25552 Pain in left hip: Secondary | ICD-10-CM | POA: Diagnosis not present

## 2015-07-28 DIAGNOSIS — L570 Actinic keratosis: Secondary | ICD-10-CM | POA: Diagnosis not present

## 2015-07-28 DIAGNOSIS — D044 Carcinoma in situ of skin of scalp and neck: Secondary | ICD-10-CM | POA: Diagnosis not present

## 2015-10-16 DIAGNOSIS — Z79899 Other long term (current) drug therapy: Secondary | ICD-10-CM | POA: Diagnosis not present

## 2015-10-16 DIAGNOSIS — Z961 Presence of intraocular lens: Secondary | ICD-10-CM | POA: Diagnosis not present

## 2015-10-16 DIAGNOSIS — H401111 Primary open-angle glaucoma, right eye, mild stage: Secondary | ICD-10-CM | POA: Diagnosis not present

## 2015-10-16 DIAGNOSIS — H2512 Age-related nuclear cataract, left eye: Secondary | ICD-10-CM | POA: Diagnosis not present

## 2015-12-04 DIAGNOSIS — M150 Primary generalized (osteo)arthritis: Secondary | ICD-10-CM | POA: Diagnosis not present

## 2015-12-04 DIAGNOSIS — E782 Mixed hyperlipidemia: Secondary | ICD-10-CM | POA: Diagnosis not present

## 2015-12-04 DIAGNOSIS — K219 Gastro-esophageal reflux disease without esophagitis: Secondary | ICD-10-CM | POA: Diagnosis not present

## 2015-12-04 DIAGNOSIS — I10 Essential (primary) hypertension: Secondary | ICD-10-CM | POA: Diagnosis not present

## 2015-12-04 DIAGNOSIS — N390 Urinary tract infection, site not specified: Secondary | ICD-10-CM | POA: Diagnosis not present

## 2015-12-10 DIAGNOSIS — I10 Essential (primary) hypertension: Secondary | ICD-10-CM | POA: Diagnosis not present

## 2015-12-10 DIAGNOSIS — M150 Primary generalized (osteo)arthritis: Secondary | ICD-10-CM | POA: Diagnosis not present

## 2015-12-10 DIAGNOSIS — E782 Mixed hyperlipidemia: Secondary | ICD-10-CM | POA: Diagnosis not present

## 2015-12-10 DIAGNOSIS — K219 Gastro-esophageal reflux disease without esophagitis: Secondary | ICD-10-CM | POA: Diagnosis not present

## 2016-01-15 DIAGNOSIS — Z23 Encounter for immunization: Secondary | ICD-10-CM | POA: Diagnosis not present

## 2016-02-18 ENCOUNTER — Other Ambulatory Visit: Payer: Self-pay | Admitting: Oncology

## 2016-02-18 DIAGNOSIS — Z853 Personal history of malignant neoplasm of breast: Secondary | ICD-10-CM

## 2016-02-19 DIAGNOSIS — Z9842 Cataract extraction status, left eye: Secondary | ICD-10-CM | POA: Diagnosis not present

## 2016-02-19 DIAGNOSIS — H401111 Primary open-angle glaucoma, right eye, mild stage: Secondary | ICD-10-CM | POA: Diagnosis not present

## 2016-03-17 ENCOUNTER — Other Ambulatory Visit: Payer: Self-pay | Admitting: Oncology

## 2016-03-17 ENCOUNTER — Other Ambulatory Visit: Payer: Self-pay

## 2016-03-17 DIAGNOSIS — Z853 Personal history of malignant neoplasm of breast: Secondary | ICD-10-CM

## 2016-03-18 ENCOUNTER — Ambulatory Visit
Admission: RE | Admit: 2016-03-18 | Discharge: 2016-03-18 | Disposition: A | Discharge status: Home / Self Care | Payer: Medicare Other | Source: Ambulatory Visit | Attending: Oncology | Admitting: Oncology

## 2016-03-18 DIAGNOSIS — Z853 Personal history of malignant neoplasm of breast: Secondary | ICD-10-CM

## 2016-03-18 DIAGNOSIS — R928 Other abnormal and inconclusive findings on diagnostic imaging of breast: Secondary | ICD-10-CM | POA: Diagnosis not present

## 2016-05-27 DIAGNOSIS — H401111 Primary open-angle glaucoma, right eye, mild stage: Secondary | ICD-10-CM | POA: Diagnosis not present

## 2016-05-27 DIAGNOSIS — H2511 Age-related nuclear cataract, right eye: Secondary | ICD-10-CM | POA: Diagnosis not present

## 2016-05-27 DIAGNOSIS — Z961 Presence of intraocular lens: Secondary | ICD-10-CM | POA: Diagnosis not present

## 2016-06-15 DIAGNOSIS — S81811A Laceration without foreign body, right lower leg, initial encounter: Secondary | ICD-10-CM | POA: Diagnosis not present

## 2016-06-18 DIAGNOSIS — S81811A Laceration without foreign body, right lower leg, initial encounter: Secondary | ICD-10-CM | POA: Diagnosis not present

## 2016-06-18 DIAGNOSIS — L089 Local infection of the skin and subcutaneous tissue, unspecified: Secondary | ICD-10-CM | POA: Diagnosis not present

## 2016-06-23 DIAGNOSIS — K219 Gastro-esophageal reflux disease without esophagitis: Secondary | ICD-10-CM | POA: Diagnosis not present

## 2016-06-23 DIAGNOSIS — E782 Mixed hyperlipidemia: Secondary | ICD-10-CM | POA: Diagnosis not present

## 2016-06-25 DIAGNOSIS — L089 Local infection of the skin and subcutaneous tissue, unspecified: Secondary | ICD-10-CM | POA: Diagnosis not present

## 2016-06-25 DIAGNOSIS — S81811D Laceration without foreign body, right lower leg, subsequent encounter: Secondary | ICD-10-CM | POA: Diagnosis not present

## 2016-07-07 DIAGNOSIS — I10 Essential (primary) hypertension: Secondary | ICD-10-CM | POA: Diagnosis not present

## 2016-07-07 DIAGNOSIS — M150 Primary generalized (osteo)arthritis: Secondary | ICD-10-CM | POA: Diagnosis not present

## 2016-07-07 DIAGNOSIS — E782 Mixed hyperlipidemia: Secondary | ICD-10-CM | POA: Diagnosis not present

## 2016-07-07 DIAGNOSIS — L0390 Cellulitis, unspecified: Secondary | ICD-10-CM | POA: Diagnosis not present

## 2016-07-27 DIAGNOSIS — R5383 Other fatigue: Secondary | ICD-10-CM | POA: Diagnosis not present

## 2016-07-27 DIAGNOSIS — R109 Unspecified abdominal pain: Secondary | ICD-10-CM | POA: Diagnosis not present

## 2016-07-27 DIAGNOSIS — R110 Nausea: Secondary | ICD-10-CM | POA: Diagnosis not present

## 2016-07-27 DIAGNOSIS — R140 Abdominal distension (gaseous): Secondary | ICD-10-CM | POA: Diagnosis not present

## 2016-07-29 DIAGNOSIS — R109 Unspecified abdominal pain: Secondary | ICD-10-CM | POA: Diagnosis not present

## 2016-07-29 DIAGNOSIS — R110 Nausea: Secondary | ICD-10-CM | POA: Diagnosis not present

## 2016-07-29 DIAGNOSIS — R140 Abdominal distension (gaseous): Secondary | ICD-10-CM | POA: Diagnosis not present

## 2016-07-29 DIAGNOSIS — K8020 Calculus of gallbladder without cholecystitis without obstruction: Secondary | ICD-10-CM | POA: Diagnosis not present

## 2016-08-04 DIAGNOSIS — N390 Urinary tract infection, site not specified: Secondary | ICD-10-CM | POA: Diagnosis not present

## 2016-08-04 DIAGNOSIS — R5383 Other fatigue: Secondary | ICD-10-CM | POA: Diagnosis not present

## 2016-08-19 ENCOUNTER — Other Ambulatory Visit: Payer: Self-pay | Admitting: Internal Medicine

## 2016-08-19 ENCOUNTER — Ambulatory Visit
Admission: RE | Admit: 2016-08-19 | Discharge: 2016-08-19 | Disposition: A | Discharge status: Home / Self Care | Payer: Medicare Other | Source: Ambulatory Visit | Attending: Internal Medicine | Admitting: Internal Medicine

## 2016-08-19 DIAGNOSIS — K8020 Calculus of gallbladder without cholecystitis without obstruction: Secondary | ICD-10-CM | POA: Diagnosis not present

## 2016-08-19 DIAGNOSIS — R944 Abnormal results of kidney function studies: Secondary | ICD-10-CM | POA: Diagnosis not present

## 2016-08-19 DIAGNOSIS — R112 Nausea with vomiting, unspecified: Secondary | ICD-10-CM

## 2016-08-19 DIAGNOSIS — K5730 Diverticulosis of large intestine without perforation or abscess without bleeding: Secondary | ICD-10-CM | POA: Diagnosis not present

## 2016-08-19 DIAGNOSIS — R1011 Right upper quadrant pain: Secondary | ICD-10-CM | POA: Diagnosis not present

## 2016-08-19 DIAGNOSIS — N390 Urinary tract infection, site not specified: Secondary | ICD-10-CM | POA: Diagnosis not present

## 2016-08-19 MED ORDER — IOPAMIDOL (ISOVUE-300) INJECTION 61%
100.0000 mL | Freq: Once | INTRAVENOUS | Status: AC | PRN
  Administered 2016-08-19: 100 mL via INTRAVENOUS

## 2016-08-27 DIAGNOSIS — K529 Noninfective gastroenteritis and colitis, unspecified: Secondary | ICD-10-CM | POA: Diagnosis not present

## 2016-08-27 DIAGNOSIS — N289 Disorder of kidney and ureter, unspecified: Secondary | ICD-10-CM | POA: Diagnosis not present

## 2016-09-20 DIAGNOSIS — H25011 Cortical age-related cataract, right eye: Secondary | ICD-10-CM | POA: Diagnosis not present

## 2016-09-20 DIAGNOSIS — Z961 Presence of intraocular lens: Secondary | ICD-10-CM | POA: Diagnosis not present

## 2016-09-20 DIAGNOSIS — H35033 Hypertensive retinopathy, bilateral: Secondary | ICD-10-CM | POA: Diagnosis not present

## 2016-09-20 DIAGNOSIS — H2511 Age-related nuclear cataract, right eye: Secondary | ICD-10-CM | POA: Diagnosis not present

## 2016-09-24 DIAGNOSIS — K529 Noninfective gastroenteritis and colitis, unspecified: Secondary | ICD-10-CM | POA: Diagnosis not present

## 2016-09-24 DIAGNOSIS — N289 Disorder of kidney and ureter, unspecified: Secondary | ICD-10-CM | POA: Diagnosis not present

## 2016-10-01 DIAGNOSIS — K529 Noninfective gastroenteritis and colitis, unspecified: Secondary | ICD-10-CM | POA: Diagnosis not present

## 2016-12-03 DIAGNOSIS — E782 Mixed hyperlipidemia: Secondary | ICD-10-CM | POA: Diagnosis not present

## 2016-12-10 DIAGNOSIS — H401111 Primary open-angle glaucoma, right eye, mild stage: Secondary | ICD-10-CM | POA: Diagnosis not present

## 2016-12-28 DIAGNOSIS — L570 Actinic keratosis: Secondary | ICD-10-CM | POA: Diagnosis not present

## 2016-12-28 DIAGNOSIS — C4442 Squamous cell carcinoma of skin of scalp and neck: Secondary | ICD-10-CM | POA: Diagnosis not present

## 2017-01-18 DIAGNOSIS — M25552 Pain in left hip: Secondary | ICD-10-CM | POA: Diagnosis not present

## 2017-01-18 DIAGNOSIS — Z0000 Encounter for general adult medical examination without abnormal findings: Secondary | ICD-10-CM | POA: Diagnosis not present

## 2017-01-18 DIAGNOSIS — M150 Primary generalized (osteo)arthritis: Secondary | ICD-10-CM | POA: Diagnosis not present

## 2017-01-18 DIAGNOSIS — E782 Mixed hyperlipidemia: Secondary | ICD-10-CM | POA: Diagnosis not present

## 2017-01-18 DIAGNOSIS — I10 Essential (primary) hypertension: Secondary | ICD-10-CM | POA: Diagnosis not present

## 2017-01-21 DIAGNOSIS — Z961 Presence of intraocular lens: Secondary | ICD-10-CM | POA: Diagnosis not present

## 2017-01-21 DIAGNOSIS — H2511 Age-related nuclear cataract, right eye: Secondary | ICD-10-CM | POA: Diagnosis not present

## 2017-01-21 DIAGNOSIS — H401111 Primary open-angle glaucoma, right eye, mild stage: Secondary | ICD-10-CM | POA: Diagnosis not present

## 2017-03-07 DIAGNOSIS — M797 Fibromyalgia: Secondary | ICD-10-CM | POA: Diagnosis not present

## 2017-03-07 DIAGNOSIS — K219 Gastro-esophageal reflux disease without esophagitis: Secondary | ICD-10-CM | POA: Diagnosis not present

## 2017-03-07 DIAGNOSIS — I10 Essential (primary) hypertension: Secondary | ICD-10-CM | POA: Diagnosis not present

## 2017-03-07 DIAGNOSIS — Z23 Encounter for immunization: Secondary | ICD-10-CM | POA: Diagnosis not present

## 2017-03-07 DIAGNOSIS — E782 Mixed hyperlipidemia: Secondary | ICD-10-CM | POA: Diagnosis not present

## 2017-05-19 DIAGNOSIS — E782 Mixed hyperlipidemia: Secondary | ICD-10-CM | POA: Diagnosis not present

## 2017-05-19 DIAGNOSIS — I10 Essential (primary) hypertension: Secondary | ICD-10-CM | POA: Diagnosis not present

## 2017-06-21 DIAGNOSIS — M25552 Pain in left hip: Secondary | ICD-10-CM | POA: Diagnosis not present

## 2017-06-29 ENCOUNTER — Encounter: Payer: Self-pay | Admitting: Internal Medicine

## 2017-07-14 DIAGNOSIS — Z01818 Encounter for other preprocedural examination: Secondary | ICD-10-CM | POA: Diagnosis not present

## 2017-07-14 DIAGNOSIS — Z853 Personal history of malignant neoplasm of breast: Secondary | ICD-10-CM | POA: Diagnosis not present

## 2017-07-14 DIAGNOSIS — R413 Other amnesia: Secondary | ICD-10-CM | POA: Diagnosis not present

## 2017-07-14 DIAGNOSIS — I231 Atrial septal defect as current complication following acute myocardial infarction: Secondary | ICD-10-CM | POA: Diagnosis not present

## 2017-07-14 DIAGNOSIS — M1612 Unilateral primary osteoarthritis, left hip: Secondary | ICD-10-CM | POA: Diagnosis not present

## 2017-07-18 DIAGNOSIS — Z01818 Encounter for other preprocedural examination: Secondary | ICD-10-CM | POA: Diagnosis not present

## 2017-07-18 DIAGNOSIS — Z96642 Presence of left artificial hip joint: Secondary | ICD-10-CM | POA: Diagnosis not present

## 2017-07-27 DIAGNOSIS — Z7982 Long term (current) use of aspirin: Secondary | ICD-10-CM | POA: Diagnosis not present

## 2017-07-27 DIAGNOSIS — Z886 Allergy status to analgesic agent status: Secondary | ICD-10-CM | POA: Diagnosis not present

## 2017-07-27 DIAGNOSIS — Z79899 Other long term (current) drug therapy: Secondary | ICD-10-CM | POA: Diagnosis not present

## 2017-07-27 DIAGNOSIS — Z96642 Presence of left artificial hip joint: Secondary | ICD-10-CM | POA: Diagnosis not present

## 2017-07-27 DIAGNOSIS — M1612 Unilateral primary osteoarthritis, left hip: Secondary | ICD-10-CM | POA: Diagnosis not present

## 2017-07-27 DIAGNOSIS — Z471 Aftercare following joint replacement surgery: Secondary | ICD-10-CM | POA: Diagnosis not present

## 2017-07-29 DIAGNOSIS — M25552 Pain in left hip: Secondary | ICD-10-CM | POA: Diagnosis not present

## 2017-08-24 DIAGNOSIS — M25552 Pain in left hip: Secondary | ICD-10-CM | POA: Diagnosis not present

## 2017-08-24 DIAGNOSIS — Z471 Aftercare following joint replacement surgery: Secondary | ICD-10-CM | POA: Diagnosis not present

## 2017-10-06 DIAGNOSIS — E782 Mixed hyperlipidemia: Secondary | ICD-10-CM | POA: Diagnosis not present

## 2017-10-06 DIAGNOSIS — I10 Essential (primary) hypertension: Secondary | ICD-10-CM | POA: Diagnosis not present

## 2017-10-17 DIAGNOSIS — M150 Primary generalized (osteo)arthritis: Secondary | ICD-10-CM | POA: Diagnosis not present

## 2017-10-17 DIAGNOSIS — E782 Mixed hyperlipidemia: Secondary | ICD-10-CM | POA: Diagnosis not present

## 2017-10-17 DIAGNOSIS — K219 Gastro-esophageal reflux disease without esophagitis: Secondary | ICD-10-CM | POA: Diagnosis not present

## 2017-10-17 DIAGNOSIS — I10 Essential (primary) hypertension: Secondary | ICD-10-CM | POA: Diagnosis not present

## 2017-12-07 DIAGNOSIS — H2511 Age-related nuclear cataract, right eye: Secondary | ICD-10-CM | POA: Diagnosis not present

## 2017-12-07 DIAGNOSIS — H35033 Hypertensive retinopathy, bilateral: Secondary | ICD-10-CM | POA: Diagnosis not present

## 2017-12-07 DIAGNOSIS — Z961 Presence of intraocular lens: Secondary | ICD-10-CM | POA: Diagnosis not present

## 2017-12-07 DIAGNOSIS — H25011 Cortical age-related cataract, right eye: Secondary | ICD-10-CM | POA: Diagnosis not present

## 2018-03-23 DIAGNOSIS — K219 Gastro-esophageal reflux disease without esophagitis: Secondary | ICD-10-CM | POA: Diagnosis not present

## 2018-03-23 DIAGNOSIS — I10 Essential (primary) hypertension: Secondary | ICD-10-CM | POA: Diagnosis not present

## 2018-03-23 DIAGNOSIS — Z0000 Encounter for general adult medical examination without abnormal findings: Secondary | ICD-10-CM | POA: Diagnosis not present

## 2018-03-23 DIAGNOSIS — E782 Mixed hyperlipidemia: Secondary | ICD-10-CM | POA: Diagnosis not present

## 2018-03-23 DIAGNOSIS — M150 Primary generalized (osteo)arthritis: Secondary | ICD-10-CM | POA: Diagnosis not present

## 2018-03-23 DIAGNOSIS — N390 Urinary tract infection, site not specified: Secondary | ICD-10-CM | POA: Diagnosis not present

## 2018-03-27 DIAGNOSIS — E782 Mixed hyperlipidemia: Secondary | ICD-10-CM | POA: Diagnosis not present

## 2018-03-27 DIAGNOSIS — H409 Unspecified glaucoma: Secondary | ICD-10-CM | POA: Diagnosis not present

## 2018-03-27 DIAGNOSIS — Z0000 Encounter for general adult medical examination without abnormal findings: Secondary | ICD-10-CM | POA: Diagnosis not present

## 2018-03-27 DIAGNOSIS — I10 Essential (primary) hypertension: Secondary | ICD-10-CM | POA: Diagnosis not present

## 2018-03-29 ENCOUNTER — Other Ambulatory Visit: Payer: Self-pay | Admitting: Internal Medicine

## 2018-03-29 DIAGNOSIS — R634 Abnormal weight loss: Secondary | ICD-10-CM

## 2018-04-06 ENCOUNTER — Ambulatory Visit
Admission: RE | Admit: 2018-04-06 | Discharge: 2018-04-06 | Disposition: A | Discharge status: Home / Self Care | Payer: Medicare Other | Source: Ambulatory Visit | Attending: Internal Medicine | Admitting: Internal Medicine

## 2018-04-06 DIAGNOSIS — K8020 Calculus of gallbladder without cholecystitis without obstruction: Secondary | ICD-10-CM | POA: Diagnosis not present

## 2018-04-06 DIAGNOSIS — R634 Abnormal weight loss: Secondary | ICD-10-CM

## 2018-04-06 DIAGNOSIS — K5730 Diverticulosis of large intestine without perforation or abscess without bleeding: Secondary | ICD-10-CM | POA: Diagnosis not present

## 2018-04-06 MED ORDER — IOPAMIDOL (ISOVUE-300) INJECTION 61%
100.0000 mL | Freq: Once | INTRAVENOUS | Status: AC | PRN
  Administered 2018-04-06: 100 mL via INTRAVENOUS

## 2018-04-07 DIAGNOSIS — K6289 Other specified diseases of anus and rectum: Secondary | ICD-10-CM | POA: Diagnosis not present

## 2018-04-07 DIAGNOSIS — E441 Mild protein-calorie malnutrition: Secondary | ICD-10-CM | POA: Diagnosis not present

## 2018-04-20 ENCOUNTER — Ambulatory Visit: Payer: Medicare Other | Admitting: Nurse Practitioner

## 2018-04-20 ENCOUNTER — Encounter: Payer: Self-pay | Admitting: Nurse Practitioner

## 2018-04-20 VITALS — BP 120/80 | HR 68 | Ht 60.0 in | Wt 113.4 lb

## 2018-04-20 DIAGNOSIS — R634 Abnormal weight loss: Secondary | ICD-10-CM | POA: Diagnosis not present

## 2018-04-20 DIAGNOSIS — Z86010 Personal history of colonic polyps: Secondary | ICD-10-CM | POA: Diagnosis not present

## 2018-04-20 DIAGNOSIS — R9389 Abnormal findings on diagnostic imaging of other specified body structures: Secondary | ICD-10-CM

## 2018-04-20 MED ORDER — NA SULFATE-K SULFATE-MG SULF 17.5-3.13-1.6 GM/177ML PO SOLN: 1.0000 | Freq: Once | ORAL | 0 refills | Status: AC

## 2018-04-20 NOTE — Progress Notes (Signed)
Chief Complaint:    ? Rectal mass  IMPRESSION and PLAN:     26.  77 year old female with unexplained 35 pound weight loss.  No focal symptoms just poor appetite.  CT scan done for evaluation of weight loss suggests "possible" inflammation in the rectum versus rectal mass.  She has not had any blood in her stool or bowel changes.  Patient had a colonoscopy in January 2016 with no rectal findings.   -Doubt findings represent a malignant rectal mass but patient is due for polyp surveillance colonoscopy anyway so we will arrange for colonoscopy.  The risks and benefits of colonoscopy with possible polypectomy were discussed with the patient and her husband and patient agrees to proceed.  Procedure will be done 05/01/2018.  -Not mentioned in the CTAP impression but the body of scan report suggests thickening of gastric cardia.  Due to available procedure slots I did not schedule patient for a double procedure (EGD and Colonoscopy) as this would postpone workup. Will first proceed with colonoscopy. If negative and weight loss persists, consider EGD.   2.  Hx of right sided adenomatous colon polyp January 2016.  Patient is actually overdue for surveillance colonoscopy.  We will proceed with colonoscopy as discussed above   HPI:     Patient is a a 77 year old female with a history of hypertension, fibromyalgia, glaucoma, dementia , and a history of breast cancer in 2010.  She is known to Dr. Henrene Pastor for history of adenomatous colon polyps.   Patient is referred by PCP Dr.Ramachandran for an abnormal CT scan done for evaluation of a recent 35 pound weight loss. Patient has been seeing PCP recently for evaluation of unintentional weight loss.    CTAP with contrast 04/06/2018 Impression:  Diffuse hepatic steatosis, cholelithiasis without evidence for acute cholecystitis, no biliary duct dilation, pancreas appears normal, diffuse diverticulosis without diverticulitis, possible proctitis versus rectal  mass  Patient has no bowel change habits, blood in stool, abdominal pain, nausea, vomiting, fevers, night sweats, cough or any other focal symptoms.  Basically she says that her appetite is just not there.   Colonoscopy Jan 2016  ENDOSCOPIC IMPRESSION: 1. Pedunculated polyp measuring 10 mm in size was found at the cecum; polypectomy was performed using snare cautery 2. Moderate diverticulosis was noted throughout the entire examined colon 3. The examination was otherwise normal RECOMMENDATIONS: 1. Repeat Colonoscopy in 3 years.   Review of systems:     No chest pain, no SOB, no fevers, no urinary sx   Past Medical History:  Diagnosis Date  . Arthritis    OA  . Cancer [facility] [place])    breast, left side  . Fibromyalgia   . GERD (gastroesophageal reflux disease)   . Glaucoma   . 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 Hyperlipidemia   . Hypertension     Patient's surgical history, family medical history, social history, medications and allergies were all reviewed in Epic   Creatinine clearance cannot be calculated (Patient's most recent lab result is older than the maximum 21 days allowed.)  Current Outpatient Medications  Medication Sig Dispense Refill  . amLODipine (NORVASC) 5 MG tablet Take 5 mg by mouth daily.    Marland Kitchen donepezil (ARICEPT) 23 MG TABS tablet Take 1 tablet by mouth at bedtime.    . dorzolamide (TRUSOPT) 2 % ophthalmic solution 1 drop  2 (two) times daily.    Marland Kitchen latanoprost (XALATAN) 0.005 % ophthalmic solution 1 drop at bedtime.    . timolol (BETIMOL) 0.5 % ophthalmic solution 1 drop 2 (two) times daily.     No current facility-administered medications for this visit.     Physical Exam:     BP 120/80   Pulse 68   Ht 5' (1.524 m)   Wt 113 lb 6.4 oz (51.4 kg)   BMI 22.15 kg/m   GENERAL:  Pleasant thin female in NAD PSYCH: : Cooperative, normal affect EENT:   conjunctiva pink, mucous membranes moist, neck supple without masses CARDIAC:  RRR, no murmur heard, no peripheral edema PULM: Normal respiratory effort, lungs CTA bilaterally, no wheezing ABDOMEN:  Nondistended, soft, nontender. No obvious masses, no hepatomegaly,  normal bowel sounds SKIN:  turgor, no lesions seen Musculoskeletal:  Normal muscle tone, normal strength NEURO: Alert and oriented x 3, no focal neurologic deficits   Tye Savoy , NP 04/20/2018, 3:30 PM  Cc:  Merrilee Seashore, MD

## 2018-04-20 NOTE — Patient Instructions (Signed)

## 2018-04-23 NOTE — Progress Notes (Signed)
Assessment and plan reviewed 

## 2018-04-24 ENCOUNTER — Encounter: Payer: Self-pay | Admitting: Internal Medicine

## 2018-05-01 ENCOUNTER — Ambulatory Visit (AMBULATORY_SURGERY_CENTER): Payer: Medicare Other | Admitting: Internal Medicine

## 2018-05-01 ENCOUNTER — Encounter: Payer: Self-pay | Admitting: Internal Medicine

## 2018-05-01 VITALS — BP 132/84 | HR 71 | Temp 96.9°F | Resp 17 | Ht 60.0 in | Wt 113.0 lb

## 2018-05-01 DIAGNOSIS — R634 Abnormal weight loss: Secondary | ICD-10-CM

## 2018-05-01 DIAGNOSIS — K56699 Other intestinal obstruction unspecified as to partial versus complete obstruction: Secondary | ICD-10-CM | POA: Diagnosis not present

## 2018-05-01 DIAGNOSIS — K5730 Diverticulosis of large intestine without perforation or abscess without bleeding: Secondary | ICD-10-CM

## 2018-05-01 DIAGNOSIS — R9389 Abnormal findings on diagnostic imaging of other specified body structures: Secondary | ICD-10-CM

## 2018-05-01 DIAGNOSIS — Z86010 Personal history of colonic polyps: Secondary | ICD-10-CM

## 2018-05-01 DIAGNOSIS — R933 Abnormal findings on diagnostic imaging of other parts of digestive tract: Secondary | ICD-10-CM | POA: Diagnosis not present

## 2018-05-01 MED ORDER — SODIUM CHLORIDE 0.9 % IV SOLN: 500.0000 mL | Freq: Once | INTRAVENOUS | Status: DC

## 2018-05-01 NOTE — Progress Notes (Signed)
Report to PACU, RN, vss, BBS= Clear.  

## 2018-05-01 NOTE — Op Note (Signed)
[facility] Patient Name: Miranda Zavala Procedure Date: 05/01/2018 1:03 PM MRN: [id number] Endoscopist: Docia Chuck. Henrene Pastor , MD Age: 77 Referring MD:  Date of Birth: 30-Nov-1940 Gender: Female Account #: [id number] Procedure:                Colonoscopy Indications:              Abnormal CT of the GI tract, Weight loss. History                            adenomatous polyps 2016 Medicines:                Monitored Anesthesia Care Procedure:                Pre-Anesthesia Assessment:                           - Prior to the procedure, a History and Physical                            was performed, and patient medications and                            allergies were reviewed. The patient's tolerance of                            previous anesthesia was also reviewed. The risks                            and benefits of the procedure and the sedation                            options and risks were discussed with the patient.                            All questions were answered, and informed consent                            was obtained. Prior Anticoagulants: The patient has                            taken no previous anticoagulant or antiplatelet                            agents. ASA Grade Assessment: II - A patient with                            mild systemic disease. After reviewing the risks                            and benefits, the patient was deemed in                            satisfactory condition to undergo the procedure.  After obtaining informed consent, the colonoscope                            was passed under direct vision. Throughout the                            procedure, the patient's blood pressure, pulse, and                            oxygen saturations were monitored continuously. The                            Colonoscope was introduced through the anus and                            advanced to the the sigmoid colon. The  rectum was                            photographed. The quality of the bowel preparation                            was excellent. The colonoscopy was performed                            without difficulty. The patient tolerated the                            procedure well. The bowel preparation used was                            SUPREP. Scope In: 1:17:31 PM Scope Out: 1:25:09 PM Total Procedure Duration: 0 hours 7 minutes 38 seconds  Findings:                 The rectum was unremarkable. There was marked                            stenosis in an area of severe diverticulosis in the                            sigmoid colon which would not permit the passage of                            the pediatric colonoscope.                           The examination of the colon including retroflexion                            was otherwise unremarkable. Complications:            No immediate complications. Estimated blood loss:                            None. Estimated Blood Loss:  Estimated blood loss: none. Impression:               1. Severe diverticulosis with sigmoid stenosis- The                            examination was otherwise normal on direct and                            retroflexion views. The area of concern on CT in                            the rectum appeared unremarkable.                           2. Incomplete exam                           3. Unexplained weight loss. Recommendation:           1. My office will schedule virtual colonoscopy                            "sigmoid obstruction, weight loss, rule out mass"                           2. Schedule upper endoscopy to further evaluate                            weight loss Docia Chuck. Henrene Pastor, MD 05/01/2018 1:31:31 PM This report has been signed electronically.

## 2018-05-01 NOTE — Patient Instructions (Signed)
  Information on diverticulosis given to you today.  Previsit scheduled for January 3 at 11am.  Upper endoscopy scheduled for January 8 at [facility]:   Refer to the procedure report that was given to you for any specific questions about what was found during the examination.  If the procedure report does not answer your questions, please call your gastroenterologist to clarify.  If you requested that your care partner not be given the details of your procedure findings, then the procedure report has been included in a sealed envelope for you to review at your convenience later.  YOU SHOULD EXPECT: Some feelings of bloating in the abdomen. Passage of more gas than usual.  Walking can help get rid of the air that was put into your GI tract during the procedure and reduce the bloating. If you had a lower endoscopy (such as a colonoscopy or flexible sigmoidoscopy) you may notice spotting of blood in your stool or on the toilet paper. If you underwent a bowel prep for your procedure, you may not have a normal bowel movement for a few days.  Please Note:  You might notice some irritation and congestion in your nose or some drainage.  This is from the oxygen used during your procedure.  There is no need for concern and it should clear up in a day or so.  SYMPTOMS TO REPORT IMMEDIATELY:   Following lower endoscopy (colonoscopy or flexible sigmoidoscopy):  Excessive amounts of blood in the stool  Significant tenderness or worsening of abdominal pains  Swelling of the abdomen that is new, acute  Fever of 100F or higher   For urgent or emergent issues, a gastroenterologist can be reached at any hour by calling [phone].   DIET:  We do recommend a small meal at first, but then you may proceed to your regular diet.  Drink plenty of fluids but you should avoid alcoholic beverages for 24 hours.  ACTIVITY:  You should plan to take it  easy for the rest of today and you should NOT DRIVE or use heavy machinery until tomorrow (because of the sedation medicines used during the test).    FOLLOW UP: Our staff will call the number listed on your records the next business day following your procedure to check on you and address any questions or concerns that you may have regarding the information given to you following your procedure. If we do not reach you, we will leave a message.  However, if you are feeling well and you are not experiencing any problems, there is no need to return our call.  We will assume that you have returned to your regular daily activities without incident.  If any biopsies were taken you will be contacted by phone or by letter within the next 1-3 weeks.  Please call us at [phone] if you have not heard about the biopsies in 3 weeks.    SIGNATURES/CONFIDENTIALITY: You and/or your care partner have signed paperwork which will be entered into your electronic medical record.  These signatures attest to the fact that that the information above on your After Visit Summary has been reviewed and is understood.  Full responsibility of the confidentiality of this discharge information lies with you and/or your care-partner.

## 2018-05-01 NOTE — Progress Notes (Signed)
Pt's states no medical or surgical changes since previsit or office visit. 

## 2018-05-02 ENCOUNTER — Telehealth: Payer: Self-pay | Admitting: *Deleted

## 2018-05-02 NOTE — Telephone Encounter (Signed)
  Follow up Call-  Call back number 05/01/2018  Post procedure Call Back phone  # [phone]  Permission to leave phone message Yes  Some recent data might be hidden     Patient questions:  Do you have a fever, pain , or abdominal swelling? No. Pain Score  0 *  Have you tolerated food without any problems? Yes.    Have you been able to return to your normal activities? Yes.    Do you have any questions about your discharge instructions: Diet   No. Medications  No. Follow up visit  No.  Do you have questions or concerns about your Care? No.  Actions: * If pain score is 4 or above: No action needed, pain <4.

## 2018-05-04 ENCOUNTER — Other Ambulatory Visit: Payer: Self-pay

## 2018-05-04 ENCOUNTER — Telehealth: Payer: Self-pay

## 2018-05-04 DIAGNOSIS — K56609 Unspecified intestinal obstruction, unspecified as to partial versus complete obstruction: Secondary | ICD-10-CM

## 2018-05-04 NOTE — Telephone Encounter (Signed)
Left message for pt's husband to call back.

## 2018-05-04 NOTE — Telephone Encounter (Signed)
Called [facility] to schedule virtual colonoscopy and was told they are not currently doing these as the machine is down and they do not know when it will be fixed. They are the only facility to schedule these in [place]. Do you want me to try [facility] or [facility]?

## 2018-05-04 NOTE — Telephone Encounter (Signed)
Spoke with pts husband and he states he will discuss this with his wife and let Dr. Henrene Pastor know what they want to do at her appt for EGD next week. Dr. Henrene Pastor aware.

## 2018-05-04 NOTE — Telephone Encounter (Signed)
Discuss with the patient and her husband (she has a bit of dementia) if they want [place] to try an alternative facility (am not sure they would appreciate the traveling) or we can touch base with our radiology folks in a few weeks.  I do not feel that the examination is critically urgently important, though I think it is important and should be performed.  Thanks

## 2018-05-05 ENCOUNTER — Encounter: Payer: Self-pay | Admitting: Internal Medicine

## 2018-05-05 ENCOUNTER — Ambulatory Visit (AMBULATORY_SURGERY_CENTER): Payer: Self-pay

## 2018-05-05 VITALS — Ht 64.0 in | Wt 112.0 lb

## 2018-05-05 DIAGNOSIS — R634 Abnormal weight loss: Secondary | ICD-10-CM

## 2018-05-05 DIAGNOSIS — J4521 Mild intermittent asthma with (acute) exacerbation: Secondary | ICD-10-CM | POA: Diagnosis not present

## 2018-05-05 NOTE — Progress Notes (Signed)
No egg or soy allergy known to patient  No issues with past sedation with any surgeries  or procedures, no intubation problems  No diet pills per patient No home 02 use per patient  No blood thinners per patient  Pt denies issues with constipation  No A fib or A flutter  EMMI video sent to pt's e mai. Declined

## 2018-05-10 ENCOUNTER — Ambulatory Visit (AMBULATORY_SURGERY_CENTER): Payer: Medicare Other | Admitting: Internal Medicine

## 2018-05-10 ENCOUNTER — Encounter: Payer: Self-pay | Admitting: Internal Medicine

## 2018-05-10 VITALS — BP 161/86 | HR 64 | Temp 98.6°F | Resp 20

## 2018-05-10 DIAGNOSIS — K222 Esophageal obstruction: Secondary | ICD-10-CM | POA: Diagnosis not present

## 2018-05-10 DIAGNOSIS — R933 Abnormal findings on diagnostic imaging of other parts of digestive tract: Secondary | ICD-10-CM | POA: Diagnosis not present

## 2018-05-10 DIAGNOSIS — R634 Abnormal weight loss: Secondary | ICD-10-CM

## 2018-05-10 DIAGNOSIS — R9389 Abnormal findings on diagnostic imaging of other specified body structures: Secondary | ICD-10-CM

## 2018-05-10 MED ORDER — SODIUM CHLORIDE 0.9 % IV SOLN: 500.0000 mL | Freq: Once | INTRAVENOUS | Status: AC

## 2018-05-10 NOTE — Patient Instructions (Signed)
YOU HAD AN ENDOSCOPIC PROCEDURE TODAY AT [facility]:   Refer to the procedure report that was given to you for any specific questions about what was found during the examination.  If the procedure report does not answer your questions, please call your gastroenterologist to clarify.  If you requested that your care partner not be given the details of your procedure findings, then the procedure report has been included in a sealed envelope for you to review at your convenience later.  YOU SHOULD EXPECT: Some feelings of bloating in the abdomen. Passage of more gas than usual.  Walking can help get rid of the air that was put into your GI tract during the procedure and reduce the bloating. If you had a lower endoscopy (such as a colonoscopy or flexible sigmoidoscopy) you may notice spotting of blood in your stool or on the toilet paper. If you underwent a bowel prep for your procedure, you may not have a normal bowel movement for a few days.  Please Note:  You might notice some irritation and congestion in your nose or some drainage.  This is from the oxygen used during your procedure.  There is no need for concern and it should clear up in a day or so.  SYMPTOMS TO REPORT IMMEDIATELY:   Following upper endoscopy (EGD)  Vomiting of blood or coffee ground material  New chest pain or pain under the shoulder blades  Painful or persistently difficult swallowing  New shortness of breath  Fever of 100F or higher  Black, tarry-looking stools  For urgent or emergent issues, a gastroenterologist can be reached at any hour by calling [phone].   DIET:  We do recommend a small meal at first, but then you may proceed to your regular diet.  Drink plenty of fluids but you should avoid alcoholic beverages for 24 hours.  ACTIVITY:  You should plan to take it easy for the rest of today and you should NOT DRIVE or use heavy machinery until tomorrow (because of the sedation medicines used  during the test).    FOLLOW UP: Our staff will call the number listed on your records the next business day following your procedure to check on you and address any questions or concerns that you may have regarding the information given to you following your procedure. If we do not reach you, we will leave a message.  However, if you are feeling well and you are not experiencing any problems, there is no need to return our call.  We will assume that you have returned to your regular daily activities without incident.  If any biopsies were taken you will be contacted by phone or by letter within the next 1-3 weeks.  Please call us at [phone] if you have not heard about the biopsies in 3 weeks.  Follow up with primary care   SIGNATURES/CONFIDENTIALITY: You and/or your care partner have signed paperwork which will be entered into your electronic medical record.  These signatures attest to the fact that that the information above on your After Visit Summary has been reviewed and is understood.  Full responsibility of the confidentiality of this discharge information lies with you and/or your care-partner.

## 2018-05-10 NOTE — Progress Notes (Signed)
Report to PACU, RN, vss, BBS= Clear.  

## 2018-05-10 NOTE — Progress Notes (Signed)
Pt's states no medical or surgical changes since previsit or office visit. 

## 2018-05-10 NOTE — Op Note (Signed)
[facility] Patient Name: Miranda Zavala Procedure Date: 05/10/2018 10:08 AM MRN: [id number] Endoscopist: Docia Chuck. Henrene Pastor , MD Age: 78 Referring MD:  Date of Birth: 24-Jul-1940 Gender: Female Account #: [id number] Procedure:                Upper GI endoscopy Indications:              Abnormal CT of the GI tract, Weight loss Medicines:                Monitored Anesthesia Care Procedure:                Pre-Anesthesia Assessment:                           - Prior to the procedure, a History and Physical                            was performed, and patient medications and                            allergies were reviewed. The patient's tolerance of                            previous anesthesia was also reviewed. The risks                            and benefits of the procedure and the sedation                            options and risks were discussed with the patient.                            All questions were answered, and informed consent                            was obtained. Prior Anticoagulants: The patient has                            taken no previous anticoagulant or antiplatelet                            agents. ASA Grade Assessment: II - A patient with                            mild systemic disease. After reviewing the risks                            and benefits, the patient was deemed in                            satisfactory condition to undergo the procedure.                           After obtaining informed consent, the endoscope was  passed under direct vision. Throughout the                            procedure, the patient's blood pressure, pulse, and                            oxygen saturations were monitored continuously. The                            Endoscope was introduced through the mouth, and                            advanced to the second part of duodenum. The upper                            GI endoscopy was  accomplished without difficulty.                            The patient tolerated the procedure well. Scope In: Scope Out: Findings:                 The esophagus was normal save an incidental large                            caliber ring.                           The stomach was normal save a sliding-type hernia.                           The examined duodenum was normal.                           The cardia and gastric fundus were normal on                            retroflexion. Complications:            No immediate complications. Estimated Blood Loss:     Estimated blood loss: none. Impression:               - Esophageal ring.                           - Normal stomach.                           - Normal examined duodenum.                           - Nothing to explain weight loss. I strongly                            suspect that weight loss is the direct result of                            poor appetite. May have underlying  depression.. Recommendation:           - Patient has a contact number available for                            emergencies. The signs and symptoms of potential                            delayed complications were discussed with the                            patient. Return to normal activities tomorrow.                            Written discharge instructions were provided to the                            patient.                           - Resume previous diet.                           - Continue present medications.                           -Discussed virtual colonoscopy from previous exam.                            The area of question was the rectum which was                            cleared. Decision regarding this examination to be                            determined                           - Follow-up with PCP Docia Chuck. Henrene Pastor, MD 05/10/2018 10:27:11 AM This report has been signed electronically.

## 2018-05-11 ENCOUNTER — Telehealth: Payer: Self-pay

## 2018-05-11 NOTE — Telephone Encounter (Signed)
  Follow up Call-  Call back number 05/10/2018 05/01/2018  Post procedure Call Back phone  # [phone]  Permission to leave phone message Yes Yes  Some recent data might be hidden     Patient questions:  Do you have a fever, pain , or abdominal swelling? No. Pain Score  0 *  Have you tolerated food without any problems? Yes.    Have you been able to return to your normal activities? Yes.    Do you have any questions about your discharge instructions: Diet   No. Medications  No. Follow up visit  No.  Do you have questions or concerns about your Care? No.  Actions: * If pain score is 4 or above: No action needed, pain <4.  No problems noted per pt. maw

## 2018-05-24 DIAGNOSIS — E441 Mild protein-calorie malnutrition: Secondary | ICD-10-CM | POA: Diagnosis not present

## 2018-05-24 DIAGNOSIS — I10 Essential (primary) hypertension: Secondary | ICD-10-CM | POA: Diagnosis not present

## 2018-05-24 DIAGNOSIS — E782 Mixed hyperlipidemia: Secondary | ICD-10-CM | POA: Diagnosis not present

## 2018-07-03 DIAGNOSIS — C4442 Squamous cell carcinoma of skin of scalp and neck: Secondary | ICD-10-CM | POA: Diagnosis not present

## 2018-07-03 DIAGNOSIS — C4492 Squamous cell carcinoma of skin, unspecified: Secondary | ICD-10-CM | POA: Diagnosis not present

## 2018-07-03 DIAGNOSIS — L089 Local infection of the skin and subcutaneous tissue, unspecified: Secondary | ICD-10-CM | POA: Diagnosis not present

## 2018-07-03 DIAGNOSIS — L080 Pyoderma: Secondary | ICD-10-CM | POA: Diagnosis not present

## 2018-07-26 DIAGNOSIS — C4442 Squamous cell carcinoma of skin of scalp and neck: Secondary | ICD-10-CM | POA: Diagnosis not present

## 2018-07-26 DIAGNOSIS — Z85828 Personal history of other malignant neoplasm of skin: Secondary | ICD-10-CM | POA: Diagnosis not present

## 2018-07-31 DIAGNOSIS — C4442 Squamous cell carcinoma of skin of scalp and neck: Secondary | ICD-10-CM | POA: Diagnosis not present

## 2018-08-01 DIAGNOSIS — C4442 Squamous cell carcinoma of skin of scalp and neck: Secondary | ICD-10-CM | POA: Diagnosis not present

## 2018-08-04 DIAGNOSIS — H401111 Primary open-angle glaucoma, right eye, mild stage: Secondary | ICD-10-CM | POA: Diagnosis not present

## 2018-08-04 DIAGNOSIS — C4442 Squamous cell carcinoma of skin of scalp and neck: Secondary | ICD-10-CM | POA: Diagnosis not present

## 2018-08-04 DIAGNOSIS — Z853 Personal history of malignant neoplasm of breast: Secondary | ICD-10-CM | POA: Diagnosis not present

## 2018-08-04 DIAGNOSIS — Z79899 Other long term (current) drug therapy: Secondary | ICD-10-CM | POA: Diagnosis not present

## 2018-08-04 DIAGNOSIS — I10 Essential (primary) hypertension: Secondary | ICD-10-CM | POA: Diagnosis not present

## 2018-08-04 DIAGNOSIS — L570 Actinic keratosis: Secondary | ICD-10-CM | POA: Diagnosis not present

## 2018-08-04 DIAGNOSIS — Z96649 Presence of unspecified artificial hip joint: Secondary | ICD-10-CM | POA: Diagnosis not present

## 2018-08-04 DIAGNOSIS — D649 Anemia, unspecified: Secondary | ICD-10-CM | POA: Diagnosis not present

## 2018-08-08 DIAGNOSIS — T8189XA Other complications of procedures, not elsewhere classified, initial encounter: Secondary | ICD-10-CM | POA: Diagnosis not present

## 2018-08-10 DIAGNOSIS — E441 Mild protein-calorie malnutrition: Secondary | ICD-10-CM | POA: Diagnosis not present

## 2018-08-10 DIAGNOSIS — E782 Mixed hyperlipidemia: Secondary | ICD-10-CM | POA: Diagnosis not present

## 2018-08-10 DIAGNOSIS — I10 Essential (primary) hypertension: Secondary | ICD-10-CM | POA: Diagnosis not present

## 2018-08-15 DIAGNOSIS — C4442 Squamous cell carcinoma of skin of scalp and neck: Secondary | ICD-10-CM | POA: Diagnosis not present

## 2018-08-15 DIAGNOSIS — Z09 Encounter for follow-up examination after completed treatment for conditions other than malignant neoplasm: Secondary | ICD-10-CM | POA: Diagnosis not present

## 2018-08-16 DIAGNOSIS — E876 Hypokalemia: Secondary | ICD-10-CM | POA: Diagnosis not present

## 2018-08-16 DIAGNOSIS — E441 Mild protein-calorie malnutrition: Secondary | ICD-10-CM | POA: Diagnosis not present

## 2018-08-16 DIAGNOSIS — E782 Mixed hyperlipidemia: Secondary | ICD-10-CM | POA: Diagnosis not present

## 2018-08-29 DIAGNOSIS — C4442 Squamous cell carcinoma of skin of scalp and neck: Secondary | ICD-10-CM | POA: Diagnosis not present

## 2018-09-07 DIAGNOSIS — N390 Urinary tract infection, site not specified: Secondary | ICD-10-CM | POA: Diagnosis not present

## 2018-09-07 DIAGNOSIS — T8189XA Other complications of procedures, not elsewhere classified, initial encounter: Secondary | ICD-10-CM | POA: Diagnosis not present

## 2018-09-19 DIAGNOSIS — C4442 Squamous cell carcinoma of skin of scalp and neck: Secondary | ICD-10-CM | POA: Diagnosis not present

## 2018-10-08 DIAGNOSIS — T8189XA Other complications of procedures, not elsewhere classified, initial encounter: Secondary | ICD-10-CM | POA: Diagnosis not present

## 2018-11-07 DIAGNOSIS — T8189XA Other complications of procedures, not elsewhere classified, initial encounter: Secondary | ICD-10-CM | POA: Diagnosis not present

## 2018-12-05 DIAGNOSIS — C4442 Squamous cell carcinoma of skin of scalp and neck: Secondary | ICD-10-CM | POA: Diagnosis not present

## 2018-12-08 DIAGNOSIS — T8189XA Other complications of procedures, not elsewhere classified, initial encounter: Secondary | ICD-10-CM | POA: Diagnosis not present

## 2019-01-03 DIAGNOSIS — E782 Mixed hyperlipidemia: Secondary | ICD-10-CM | POA: Diagnosis not present

## 2019-01-03 DIAGNOSIS — E876 Hypokalemia: Secondary | ICD-10-CM | POA: Diagnosis not present

## 2019-01-03 DIAGNOSIS — E441 Mild protein-calorie malnutrition: Secondary | ICD-10-CM | POA: Diagnosis not present

## 2019-01-08 DIAGNOSIS — T8189XA Other complications of procedures, not elsewhere classified, initial encounter: Secondary | ICD-10-CM | POA: Diagnosis not present

## 2019-01-10 DIAGNOSIS — E441 Mild protein-calorie malnutrition: Secondary | ICD-10-CM | POA: Diagnosis not present

## 2019-01-10 DIAGNOSIS — Z23 Encounter for immunization: Secondary | ICD-10-CM | POA: Diagnosis not present

## 2019-01-10 DIAGNOSIS — I10 Essential (primary) hypertension: Secondary | ICD-10-CM | POA: Diagnosis not present

## 2019-01-10 DIAGNOSIS — E782 Mixed hyperlipidemia: Secondary | ICD-10-CM | POA: Diagnosis not present

## 2019-01-10 DIAGNOSIS — M797 Fibromyalgia: Secondary | ICD-10-CM | POA: Diagnosis not present

## 2019-02-07 DIAGNOSIS — T8189XA Other complications of procedures, not elsewhere classified, initial encounter: Secondary | ICD-10-CM | POA: Diagnosis not present

## 2019-03-10 DIAGNOSIS — S93491A Sprain of other ligament of right ankle, initial encounter: Secondary | ICD-10-CM | POA: Diagnosis not present

## 2019-03-10 DIAGNOSIS — T8189XA Other complications of procedures, not elsewhere classified, initial encounter: Secondary | ICD-10-CM | POA: Diagnosis not present

## 2019-03-10 DIAGNOSIS — M79671 Pain in right foot: Secondary | ICD-10-CM | POA: Diagnosis not present

## 2019-04-10 DIAGNOSIS — M47892 Other spondylosis, cervical region: Secondary | ICD-10-CM | POA: Diagnosis not present

## 2019-04-10 DIAGNOSIS — M4802 Spinal stenosis, cervical region: Secondary | ICD-10-CM | POA: Diagnosis not present

## 2019-04-10 DIAGNOSIS — M899 Disorder of bone, unspecified: Secondary | ICD-10-CM | POA: Diagnosis not present

## 2019-04-10 DIAGNOSIS — S199XXA Unspecified injury of neck, initial encounter: Secondary | ICD-10-CM | POA: Diagnosis not present

## 2019-04-10 DIAGNOSIS — S0003XA Contusion of scalp, initial encounter: Secondary | ICD-10-CM | POA: Diagnosis not present

## 2019-04-10 DIAGNOSIS — J320 Chronic maxillary sinusitis: Secondary | ICD-10-CM | POA: Diagnosis not present

## 2019-04-10 DIAGNOSIS — S0181XA Laceration without foreign body of other part of head, initial encounter: Secondary | ICD-10-CM | POA: Diagnosis not present

## 2019-04-10 DIAGNOSIS — J672 Bird fancier's lung: Secondary | ICD-10-CM | POA: Diagnosis not present

## 2019-04-10 DIAGNOSIS — R519 Headache, unspecified: Secondary | ICD-10-CM | POA: Diagnosis not present

## 2019-04-10 DIAGNOSIS — W010XXA Fall on same level from slipping, tripping and stumbling without subsequent striking against object, initial encounter: Secondary | ICD-10-CM | POA: Diagnosis not present

## 2019-04-10 DIAGNOSIS — Y998 Other external cause status: Secondary | ICD-10-CM | POA: Diagnosis not present

## 2019-04-10 DIAGNOSIS — G44309 Post-traumatic headache, unspecified, not intractable: Secondary | ICD-10-CM | POA: Diagnosis not present

## 2019-04-16 DIAGNOSIS — Z4802 Encounter for removal of sutures: Secondary | ICD-10-CM | POA: Diagnosis not present

## 2019-04-16 DIAGNOSIS — S0181XA Laceration without foreign body of other part of head, initial encounter: Secondary | ICD-10-CM | POA: Diagnosis not present

## 2019-04-19 DIAGNOSIS — E441 Mild protein-calorie malnutrition: Secondary | ICD-10-CM | POA: Diagnosis not present

## 2019-04-19 DIAGNOSIS — M797 Fibromyalgia: Secondary | ICD-10-CM | POA: Diagnosis not present

## 2019-04-19 DIAGNOSIS — Z0000 Encounter for general adult medical examination without abnormal findings: Secondary | ICD-10-CM | POA: Diagnosis not present

## 2019-04-19 DIAGNOSIS — I10 Essential (primary) hypertension: Secondary | ICD-10-CM | POA: Diagnosis not present

## 2019-04-19 DIAGNOSIS — E782 Mixed hyperlipidemia: Secondary | ICD-10-CM | POA: Diagnosis not present

## 2019-04-24 DIAGNOSIS — J4521 Mild intermittent asthma with (acute) exacerbation: Secondary | ICD-10-CM | POA: Diagnosis not present

## 2019-04-24 DIAGNOSIS — E441 Mild protein-calorie malnutrition: Secondary | ICD-10-CM | POA: Diagnosis not present

## 2019-04-24 DIAGNOSIS — Z0000 Encounter for general adult medical examination without abnormal findings: Secondary | ICD-10-CM | POA: Diagnosis not present

## 2019-04-24 DIAGNOSIS — M797 Fibromyalgia: Secondary | ICD-10-CM | POA: Diagnosis not present

## 2019-07-31 DIAGNOSIS — N390 Urinary tract infection, site not specified: Secondary | ICD-10-CM | POA: Diagnosis not present

## 2019-11-07 DIAGNOSIS — L720 Epidermal cyst: Secondary | ICD-10-CM | POA: Diagnosis not present

## 2019-11-28 DIAGNOSIS — C44529 Squamous cell carcinoma of skin of other part of trunk: Secondary | ICD-10-CM | POA: Diagnosis not present

## 2019-11-28 DIAGNOSIS — L720 Epidermal cyst: Secondary | ICD-10-CM | POA: Diagnosis not present

## 2019-12-14 DIAGNOSIS — C4442 Squamous cell carcinoma of skin of scalp and neck: Secondary | ICD-10-CM | POA: Diagnosis not present

## 2019-12-14 DIAGNOSIS — R64 Cachexia: Secondary | ICD-10-CM | POA: Diagnosis not present

## 2019-12-14 DIAGNOSIS — Z885 Allergy status to narcotic agent status: Secondary | ICD-10-CM | POA: Diagnosis not present

## 2019-12-14 DIAGNOSIS — Z886 Allergy status to analgesic agent status: Secondary | ICD-10-CM | POA: Diagnosis not present

## 2019-12-21 DIAGNOSIS — M799 Soft tissue disorder, unspecified: Secondary | ICD-10-CM | POA: Diagnosis not present

## 2019-12-21 DIAGNOSIS — C4442 Squamous cell carcinoma of skin of scalp and neck: Secondary | ICD-10-CM | POA: Diagnosis not present

## 2019-12-21 DIAGNOSIS — M899 Disorder of bone, unspecified: Secondary | ICD-10-CM | POA: Diagnosis not present

## 2019-12-21 DIAGNOSIS — R928 Other abnormal and inconclusive findings on diagnostic imaging of breast: Secondary | ICD-10-CM | POA: Diagnosis not present

## 2020-01-01 DIAGNOSIS — R911 Solitary pulmonary nodule: Secondary | ICD-10-CM | POA: Diagnosis not present

## 2020-01-01 DIAGNOSIS — M899 Disorder of bone, unspecified: Secondary | ICD-10-CM | POA: Diagnosis not present

## 2020-01-01 DIAGNOSIS — Z742 Need for assistance at home and no other household member able to render care: Secondary | ICD-10-CM | POA: Diagnosis not present

## 2020-01-01 DIAGNOSIS — Z923 Personal history of irradiation: Secondary | ICD-10-CM | POA: Diagnosis not present

## 2020-01-01 DIAGNOSIS — Z993 Dependence on wheelchair: Secondary | ICD-10-CM | POA: Diagnosis not present

## 2020-01-01 DIAGNOSIS — C4442 Squamous cell carcinoma of skin of scalp and neck: Secondary | ICD-10-CM | POA: Diagnosis not present

## 2020-01-01 DIAGNOSIS — Z853 Personal history of malignant neoplasm of breast: Secondary | ICD-10-CM | POA: Diagnosis not present

## 2020-02-01 DEATH — deceased
